# Patient Record
Sex: Female | Born: 1960
Health system: Southern US, Community
[De-identification: ages and names within clinical notes are randomized; demographics above are authoritative.]

## PROBLEM LIST (undated history)

## (undated) DIAGNOSIS — N189 Chronic kidney disease, unspecified: Secondary | ICD-10-CM

## (undated) DIAGNOSIS — H539 Unspecified visual disturbance: Secondary | ICD-10-CM

## (undated) HISTORY — DX: Chronic kidney disease, unspecified: N18.9

## (undated) HISTORY — DX: Unspecified visual disturbance: H53.9

## (undated) HISTORY — PX: MOUTH SURGERY: SHX715

## (undated) HISTORY — PX: ELBOW FRACTURE SURGERY: SHX616

## (undated) HISTORY — PX: ABDOMINOPLASTY: SUR9

---

## 2016-11-18 ENCOUNTER — Encounter: Payer: Self-pay | Admitting: Neurology

## 2016-11-18 ENCOUNTER — Ambulatory Visit (INDEPENDENT_AMBULATORY_CARE_PROVIDER_SITE_OTHER): Payer: BC Managed Care – PPO | Admitting: Neurology

## 2016-11-18 ENCOUNTER — Encounter (INDEPENDENT_AMBULATORY_CARE_PROVIDER_SITE_OTHER): Payer: Self-pay

## 2016-11-18 VITALS — BP 122/85 | HR 79 | Resp 16 | Ht 71.0 in | Wt 174.0 lb

## 2016-11-18 DIAGNOSIS — G250 Essential tremor: Secondary | ICD-10-CM | POA: Diagnosis not present

## 2016-11-18 DIAGNOSIS — F32A Depression, unspecified: Secondary | ICD-10-CM

## 2016-11-18 DIAGNOSIS — R200 Anesthesia of skin: Secondary | ICD-10-CM | POA: Diagnosis not present

## 2016-11-18 DIAGNOSIS — R413 Other amnesia: Secondary | ICD-10-CM | POA: Diagnosis not present

## 2016-11-18 DIAGNOSIS — F320 Major depressive disorder, single episode, mild: Secondary | ICD-10-CM | POA: Diagnosis not present

## 2016-11-18 NOTE — Progress Notes (Signed)
[facility]  PATIENT: Ashlee Hernandez DOB: Dec 20, 1960  REFERRING DOCTOR OR PCP:  Blanchie Serve SOURCE: patient, records from Dr. Bjorn Loser.    _________________________________   HISTORICAL  CHIEF COMPLAINT:  Chief Complaint  Patient presents with  . Memory Loss    Ashlee Hernandez is here for eval of memory loss, first noted about a yr. ago.  Sts. she has particular difficulty with word finding. Denies known family hx. of dementia.  She has a hx. of ETOH and illicit drug use, but has been in recovery for 32 years.  Hx. of hand tremor associated with feeling nervous--has prn Atenolol for this.  Tremor has not worsened over the last 2 yrs./fim    HISTORY OF PRESENT ILLNESS:  I had the pleasure seeing you patient, Ashlee Hernandez, at [facility] for neurologic consultation regarding her memory loss.  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  She feels her symptoms gradually worsened.   She was going through menopause at the time.    Specifically, she notes difficulty coming up with the next word in a sentence and people sometimes quickly say the word she is trying to get out.    This happens at work and at home and does not do worse with stress.  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   She has difficulty with simple sentences as well as complex ones.  She does not note the same problems with writing and feels.     She also feels unfocused and takes extra time with some tasks.   She can focus well if doing only one thing (such as reading for pleasure) but not as well when several activities going on at the same time.     She is sleeping well in general and takes melatonin at niht with benefit.  She sleeps 7 hours nightly and rarely wakes up once asleep.   She snores on her back but has never been told she has OSA symptoms   Montreal Cognitive Assessment  11/18/2016    Visuospatial/ Executive (0/5) 5  Naming (0/3) 3  Attention: Read list of digits (0/2) 2  Attention: Read list of letters (0/1) 1  Attention: Serial 7 subtraction starting at 100 (0/3) 3  Language: Repeat phrase (0/2) 2  Language : Fluency (0/1) 1  Abstraction (0/2) 2  Delayed Recall (0/5) 2  Orientation (0/6) 6  Total 27  Adjusted Score (based on education) 27    About a year ago, she stopped her Wellbutrin, Benadryl and famotidine  Has benign essential tremor that is treated with atenolol. The tremor has worsened over the past 2 years.    The tremor started in her 54's and is worse when she is nervous. 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     Atenolol was started and she takes only if she is going to be in court some times.    The atenolol usually helps.    She also has rare migraines but only one in the last couple years.   Imitrex usually helps.     REVIEW OF SYSTEMS: Constitutional: No fevers, chills, sweats, or change in appetite Eyes: No visual changes, double vision, eye pain Ear, nose and throat: No hearing loss, ear pain, nasal congestion, sore throat Cardiovascular: No chest pain, palpitations Respiratory: No shortness of breath at rest or with exertion.   No  wheezes GastrointestinaI: No nausea, vomiting, diarrhea, abdominal pain, fecal incontinence Genitourinary: No dysuria, urinary retention or frequency.  No nocturia. Musculoskeletal: No neck pain, back pain Integumentary: No rash, pruritus, skin lesions Neurological: as above Psychiatric: No depression at this time.  No anxiety Endocrine: No palpitations, diaphoresis, change in appetite, change in weigh or increased thirst Hematologic/Lymphatic: No anemia, purpura, petechiae. Allergic/Immunologic: No itchy/runny eyes, nasal congestion, recent allergic reactions, rashes  ALLERGIES: Allergies  Allergen Reactions  . Sulfa Antibiotics Hives, Itching and Rash    HOME MEDICATIONS:  Current  Outpatient Prescriptions:  .  ADVAIR DISKUS 100-50 MCG/DOSE AEPB, , Disp: , Rfl:  .  albuterol (PROVENTIL HFA;VENTOLIN HFA) 108 (90 Base) MCG/ACT inhaler, INHALE 2 PUFFS BY MOUTH 4 TIMES A DAY EVERY 4-6 HRS INHALATION 30 **INS WON'T PAY FOR PROVENTIL**, Disp: , Rfl:  .  atenolol (TENORMIN) 25 MG tablet, Take by mouth daily., Disp: , Rfl:  .  simvastatin (ZOCOR) 10 MG tablet, TAKE 1 TABLET IN THE EVENING ONCE A DAY WITH SUPPER ORALLY 90 DAYS, Disp: , Rfl:   PAST MEDICAL HISTORY: Past Medical History:  Diagnosis Date  . Chronic kidney disease   . Vision abnormalities     PAST SURGICAL HISTORY: Past Surgical History:  Procedure Laterality Date  . ABDOMINOPLASTY    . ELBOW FRACTURE SURGERY    . MOUTH SURGERY      FAMILY HISTORY: Family History  Problem Relation Age of Onset  . Heart attack Mother   . Hypertension Mother   . Alpha-1 antitrypsin deficiency Father   . COPD Father     SOCIAL HISTORY:  Social History   Social History  . Marital status: Married    Spouse name: N/A  . Number of children: N/A  . Years of education: N/A   Occupational History  . Not on file.   Social History Main Topics  . Smoking status: Former Research scientist (life sciences)  . Smokeless tobacco: Former Systems developer  . Alcohol use Yes     Comment: 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  . Drug use: Yes     Comment: no use in the last 32 years  . Sexual activity: Not on file   Other Topics Concern  . Not on file   Social History Narrative  . No narrative on file     PHYSICAL EXAM  Vitals:   11/18/16 0845  BP: 122/85  Pulse: 79  Resp: 16  Weight: 174 lb (78.9 kg)  Height: _0  (1.803 m)    Body mass index is 24.27 kg/m.   General: The patient is well-developed and well-nourished and in no acute distress  Eyes:  Funduscopic exam shows normal optic discs and retinal vessels.  Neck: The neck is supple, no carotid bruits are noted.  The neck is nontender.  Cardiovascular: The heart has a regular  rate and rhythm with a normal S1 and S2. There were no murmurs, gallops or rubs. Lungs are clear to auscultation.  Skin: Extremities are without significant edema.  Musculoskeletal:  Back is nontender  Neurologic Exam  Mental status: The patient is alert and oriented x 3 at the time of the examination. On testing, she had mild short-term memory difficulties The patient has apparent normal remote memory, with an apparently normal attention span and concentration ability.   On a few occasions she had trouble coming up with the right word..  Cranial nerves: Extraocular movements are full. Pupils are equal, round, and reactive to light and accomodation.  Visual fields  are full.  Facial symmetry is present. There is good facial sensation to soft touch bilaterally.Facial strength is normal.  Trapezius and sternocleidomastoid strength is normal. No dysarthria is noted.  The tongue is midline, and the patient has symmetric elevation of the soft palate. No obvious hearing deficits are noted.  Motor:  Muscle bulk is normal.   Tone is normal. Strength is  5 / 5 in all 4 extremities.   Sensory: Sensory testing is intact to pinprick, soft touch and vibration sensation in all 4 extremities.  Coordination: Cerebellar testing reveals good finger-nose-finger and heel-to-shin bilaterally.  Gait and station: Station is normal.   Gait is normal. Tandem gait is normal. Romberg is negative.   Reflexes: Deep tendon reflexes are symmetric and normal bilaterally.   Plantar responses are flexor.    DIAGNOSTIC DATA (LABS, IMAGING, TESTING) - I reviewed patient records, labs, notes, testing and imaging myself where available.       ASSESSMENT AND PLAN  Memory impairment - Plan: MR BRAIN WO CONTRAST, Vitamin B12, TSH, Methylmalonic acid, serum, Multiple Myeloma Panel (SPEP&IFE w/QIG)  Numbness - Plan: Vitamin B12, Methylmalonic acid, serum, Multiple Myeloma Panel (SPEP&IFE w/QIG)  Benign essential tremor -  Plan: MR BRAIN WO CONTRAST  Mild depression ([place])   In summary, Ashlee Hernandez is a 56 year old woman has noted cognitive issues difficulties with verbal fluency and short-term memory over the past 2 years. This is affecting her function at work.  On the Montral cognitive assessment, she scored 27/30 is in the normal range but lower than expected for a high functioning educated individual.   Additionally, she has a mild tremor that is likely benign essential tremor and numbness in her feet of unknown etiology. I'll check an MRI of the brain to determine if there is aatrophy that could indicate one of the neurodegenerative processes (AD, FTD, PPA) and to make sure that there are not ischemic issues or other neurologic process. Additionally, we will check labwork for her cognitive issues and numbness (B12, MMA, SPEP/IFE).  If the workup is negative, then another etiology such as depression may be playing a more prominent role.   She will return to see me in 3 months or sooner if there are new or worsening neurologic symptoms.    Thank you for asking me to see Ashlee Hernandez for a neurologic consultation. Please let me know if I can be of further assistance with her or other patients in the future.   Giada Schoppe A. Felecia Shelling, MD, PhD 0/07/3934, 9:40 AM Certified in Neurology, Clinical Neurophysiology, Sleep Medicine, Pain Medicine and Neuroimaging  [facility] [address], [place], [place] [address] [phone]

## 2016-11-22 LAB — TSH: TSH: 1.19 u[IU]/mL (ref 0.450–4.500)

## 2016-11-22 LAB — MULTIPLE MYELOMA PANEL, SERUM
ALPHA 1: 0.2 g/dL (ref 0.0–0.4)
ALPHA2 GLOB SERPL ELPH-MCNC: 0.7 g/dL (ref 0.4–1.0)
Albumin SerPl Elph-Mcnc: 4.1 g/dL (ref 2.9–4.4)
Albumin/Glob SerPl: 1.6 (ref 0.7–1.7)
B-GLOBULIN SERPL ELPH-MCNC: 1 g/dL (ref 0.7–1.3)
GLOBULIN, TOTAL: 2.6 g/dL (ref 2.2–3.9)
Gamma Glob SerPl Elph-Mcnc: 0.8 g/dL (ref 0.4–1.8)
IGM (IMMUNOGLOBULIN M), SRM: 112 mg/dL (ref 26–217)
IgA/Immunoglobulin A, Serum: 65 mg/dL — ABNORMAL LOW (ref 87–352)
IgG (Immunoglobin G), Serum: 691 mg/dL — ABNORMAL LOW (ref 700–1600)
Total Protein: 6.7 g/dL (ref 6.0–8.5)

## 2016-11-22 LAB — METHYLMALONIC ACID, SERUM: Methylmalonic Acid: 145 nmol/L (ref 0–378)

## 2016-11-22 LAB — VITAMIN B12: VITAMIN B 12: 348 pg/mL (ref 232–1245)

## 2016-11-23 ENCOUNTER — Telehealth: Payer: Self-pay | Admitting: *Deleted

## 2016-11-23 NOTE — Telephone Encounter (Signed)
LMOM that per RAS, labs done in our office are normal.  She does not need to return this call unless she has questions/fim 

## 2016-11-23 NOTE — Telephone Encounter (Signed)
-----   Message from Asa Lenteichard A Sater, MD sent at 11/22/2016  7:15 PM EDT ----- Please let her know that the lab work is normal.

## 2016-12-01 ENCOUNTER — Ambulatory Visit (INDEPENDENT_AMBULATORY_CARE_PROVIDER_SITE_OTHER): Payer: BC Managed Care – PPO

## 2016-12-01 DIAGNOSIS — R413 Other amnesia: Secondary | ICD-10-CM

## 2016-12-01 DIAGNOSIS — G250 Essential tremor: Secondary | ICD-10-CM

## 2016-12-03 ENCOUNTER — Telehealth: Payer: Self-pay | Admitting: *Deleted

## 2016-12-03 NOTE — Telephone Encounter (Signed)
LMOM (ok per hippa) that per RAS, MRI brain showed a few spots that are likely age related, nothing noted that would lead to memory difficulties.  She does not need to return this call unless she has questions/fim

## 2016-12-03 NOTE — Telephone Encounter (Signed)
-----   Message from Asa Lente, MD sent at 12/03/2016 12:04 PM EDT ----- Please let her know that the MRI of the brain showed a few spots that are likely age related. There is no atrophy.   There is nothing that should lead to memory difficulties.

## 2017-02-15 ENCOUNTER — Ambulatory Visit: Payer: BC Managed Care – PPO | Admitting: Neurology

## 2017-03-16 ENCOUNTER — Ambulatory Visit: Payer: BC Managed Care – PPO | Admitting: Neurology

## 2017-03-16 ENCOUNTER — Encounter: Payer: Self-pay | Admitting: Neurology

## 2017-03-16 ENCOUNTER — Ambulatory Visit (INDEPENDENT_AMBULATORY_CARE_PROVIDER_SITE_OTHER): Payer: BC Managed Care – PPO | Admitting: Neurology

## 2017-03-16 VITALS — BP 121/89 | HR 74 | Resp 14 | Ht 71.0 in | Wt 167.9 lb

## 2017-03-16 DIAGNOSIS — R413 Other amnesia: Secondary | ICD-10-CM

## 2017-03-16 DIAGNOSIS — F32A Depression, unspecified: Secondary | ICD-10-CM

## 2017-03-16 DIAGNOSIS — F320 Major depressive disorder, single episode, mild: Secondary | ICD-10-CM

## 2017-03-16 DIAGNOSIS — G250 Essential tremor: Secondary | ICD-10-CM

## 2017-03-16 MED ORDER — PHENTERMINE HCL 30 MG PO CAPS: 30.0000 mg | ORAL_CAPSULE | ORAL | 5 refills | Status: DC

## 2017-03-16 NOTE — Progress Notes (Signed)
[facility]  PATIENT: Ashlee Hernandez DOB: [birth year]  REFERRING DOCTOR OR PCP:  Fatima SangerStephen Meyer SOURCE: patient, records from Dr. Daphane ShepherdMeyer.    _________________________________   HISTORICAL  CHIEF COMPLAINT:  Chief Complaint  Patient presents with  . Memory Loss    Sts. she has started Strattera and feels focus is better, but she is still having difficulty with word finding and some short term memory (for ex. couldn't remember where she and her husband went for their anniversary).  Sts. she has not had a problem with tremors and she doesn't notice numbness in feet/fim  . Tremors  . 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  Memory:    She feels her symptoms gradually worsened Over the past couple of years but have been fairly stable since her last visit with me.    4 months ago, she scored 27/30 on a much real cognitive assessment test. When her memory symptoms started, she was going through menopause .    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     She does not note any difficulty coming out with the right words when she writes.   She also feels unfocused and takes extra time with some tasks.   She can focus well if doing only one thing (such as reading for pleasure) but not as well when several activities going on at the same time.    She notes that she remembers a vacation from a couple years ago better than one that she took a few months ago.  Her primary care doctor placed her on Strattera and she feels that it is helping some.   She still notes focus is poor and she sometimes has trouble coming up with the right word.     She is sleeping well in general and takes melatonin at niht with benefit.  She sleeps 7 hours nightly and rarely wakes up once asleep.   She  snores on her back but has never been told she has OSA symptoms.     Montreal Cognitive Assessment  11/18/2016  Visuospatial/ Executive (0/5) 5  Naming (0/3) 3  Attention: Read list of digits (0/2) 2  Attention: Read list of letters (0/1) 1  Attention: Serial 7 subtraction starting at 100 (0/3) 3  Language: Repeat phrase (0/2) 2  Language : Fluency (0/1) 1  Abstraction (0/2) 2  Delayed Recall (0/5) 2  Orientation (0/6) 6  Total 27  Adjusted Score (based on education) 27    Another issue is mild benign essential tremor that has responded to atenolol.   The tremor started in her 40's and is worse when she is nervous. 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     Atenolol was started and she takes only if she is going to be in court some times.    REVIEW OF SYSTEMS: Constitutional: No fevers, chills, sweats, or change in appetite Eyes: No visual changes, double vision, eye pain Ear, nose and throat: No hearing loss, ear pain, nasal congestion, sore throat Cardiovascular: No chest pain, palpitations Respiratory: No shortness of breath at rest or with exertion.   No wheezes GastrointestinaI: No nausea, vomiting, diarrhea, abdominal pain, fecal incontinence Genitourinary: No dysuria, urinary retention or frequency.  No nocturia. Musculoskeletal: No neck pain, back pain Integumentary: No rash, pruritus, skin lesions Neurological: as above Psychiatric: No depression at this time.  No anxiety Endocrine: No palpitations, diaphoresis, change in appetite, change in weigh or increased thirst Hematologic/Lymphatic: No anemia, purpura, petechiae. Allergic/Immunologic: No itchy/runny eyes, nasal congestion, recent allergic reactions, rashes  ALLERGIES: Allergies  Allergen Reactions  . Sulfa Antibiotics Hives, Itching and Rash    HOME MEDICATIONS:  Current Outpatient Prescriptions:  .  ADVAIR DISKUS 100-50 MCG/DOSE AEPB, , Disp: , Rfl:  .  albuterol (PROVENTIL  HFA;VENTOLIN HFA) 108 (90 Base) MCG/ACT inhaler, INHALE 2 PUFFS BY MOUTH 4 TIMES A DAY EVERY 4-6 HRS INHALATION 30 **INS WON'T PAY FOR PROVENTIL**, Disp: , Rfl:  .  atenolol (TENORMIN) 25 MG tablet, Take by mouth daily., Disp: , Rfl:  .  atomoxetine (STRATTERA) 40 MG capsule, , Disp: , Rfl:  .  simvastatin (ZOCOR) 10 MG tablet, TAKE 1 TABLET IN THE EVENING ONCE A DAY WITH SUPPER ORALLY 90 DAYS, Disp: , Rfl:  .  phentermine 30 MG capsule, Take 1 capsule (30 mg total) by mouth every morning., Disp: 30 capsule, Rfl: 5  PAST MEDICAL HISTORY: Past Medical History:  Diagnosis Date  . Chronic kidney disease   . Vision abnormalities     PAST SURGICAL HISTORY: Past Surgical History:  Procedure Laterality Date  . ABDOMINOPLASTY    . ELBOW FRACTURE SURGERY    . MOUTH SURGERY      FAMILY HISTORY: Family History  Problem Relation Age of Onset  . Heart attack Mother   . Hypertension Mother   . Alpha-1 antitrypsin deficiency Father   . COPD Father     SOCIAL HISTORY:  Social History   Social History  . Marital status: Married    Spouse name: N/A  . Number of children: N/A  . Years of education: N/A   Occupational History  . Not on file.   Social History Main Topics  . Smoking status: Former Games developer  . Smokeless tobacco: Former Neurosurgeon  . Alcohol use Yes     Comment: 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  . Drug use: Yes     Comment: no use in the last 32 years  . Sexual activity: Not on file   Other Topics Concern  . Not on file   Social History Narrative  . No narrative on file     PHYSICAL EXAM  Vitals:   03/16/17 0941  BP: 121/89  Pulse: 74  Resp: 14  Weight: 167 lb 14.4 oz (76.2 kg)  Height: 5\' 11"  (1.803 m)    Body mass index is 23.42 kg/m.   General: The patient is well-developed and well-nourished and in no acute distress   Neurologic Exam  Mental status: The patient is alert and oriented x 3 at the time of the examination. STM was 3/3 at 3  minutes The patient has apparent normal remote memory, with an apparently normal attention span and concentration ability ([id number]).  Clock is perfect.   Pattern continuation with one error.   Cranial nerves: Extraocular movements are full. Facial strength is normal. Trapezius and sternocleidomastoid strength is normal. No dysarthria is noted.  The tongue is midline, and the patient has symmetric elevation of the soft palate. No obvious hearing deficits are noted.  Motor:  Muscle bulk is normal.    Strength is  5 / 5 in all 4 extremities.    Gait and station: Station is normal.   Gait is  normal.    Reflexes: Deep tendon reflexes are symmetric and normal bilaterally.      DIAGNOSTIC DATA (LABS, IMAGING, TESTING) - I reviewed patient records, labs, notes, testing and imaging myself where available.       ASSESSMENT AND PLAN  Memory impairment  Mild depression ([facility])  Benign essential tremor   1.   We discussed that her memory issues and other cognitive concerns are much more likely to be attentional than due to a primary degenerative process such as Alzheimer's.    Decreased attention could be due to mood. She does feel a little better on Strattera and it does have antidepressant effects as it is a norepinephrine reuptake inhibitor.    She will continue it. 2.  I think she would benefit from a stimulant but she had had issues with addiction in the past and is reluctant to go on one. Phentermine has some stimulant qualities and overlapped some with a more classic stimulants. I will go ahead and have her try. If she does not note a significant benefit she should stop. 3.  Her brain MRI does show some T2/FLAIR hyperintense foci consistent with mild chronic microvascular ischemic change. Although some changes typical for age, she has more than expected. She was a heavy smoker. She will take aspirin 81 mg daily. 4.  She will return to see me in 6 months or sooner if there are new  or worsening neurologic symptoms.  Richard A. Epimenio Foot, MD, PhD, FAAN Certified in Neurology, Clinical Neurophysiology, Sleep Medicine, Pain Medicine and Neuroimaging Director, [facility]  [facility] [address], [address] [place], [place] [address] [phone]

## 2017-03-17 ENCOUNTER — Telehealth: Payer: Self-pay | Admitting: *Deleted

## 2017-03-17 NOTE — Telephone Encounter (Signed)
PA for Phentermine 30mg  capsules #30/30 completed via Cover My Meds.  Dx: F90.9.  Key# [id number].  [facility] phone# [phone]/fim

## 2017-03-29 NOTE — Telephone Encounter (Signed)
Fax received from [facility]. Phentermine denied.  I lmom for pt. that cash price for Phentermine 30mg  #30 is $16.29.  If she prefers not to use [facility], she can check with several of her local pharmacies to see who has the cheapest price./fim

## 2017-09-10 ENCOUNTER — Other Ambulatory Visit: Payer: Self-pay | Admitting: Neurology

## 2017-11-11 ENCOUNTER — Other Ambulatory Visit: Payer: Self-pay | Admitting: Neurology

## 2017-12-28 ENCOUNTER — Encounter: Payer: Self-pay | Admitting: Neurology

## 2017-12-28 ENCOUNTER — Ambulatory Visit (INDEPENDENT_AMBULATORY_CARE_PROVIDER_SITE_OTHER): Payer: 59 | Admitting: Neurology

## 2017-12-28 ENCOUNTER — Other Ambulatory Visit: Payer: Self-pay

## 2017-12-28 VITALS — BP 124/90 | HR 87 | Resp 16 | Ht 71.0 in | Wt 161.5 lb

## 2017-12-28 DIAGNOSIS — R413 Other amnesia: Secondary | ICD-10-CM | POA: Diagnosis not present

## 2017-12-28 DIAGNOSIS — G4700 Insomnia, unspecified: Secondary | ICD-10-CM | POA: Diagnosis not present

## 2017-12-28 DIAGNOSIS — F988 Other specified behavioral and emotional disorders with onset usually occurring in childhood and adolescence: Secondary | ICD-10-CM

## 2017-12-28 MED ORDER — PHENTERMINE HCL 30 MG PO CAPS: 30.0000 mg | ORAL_CAPSULE | Freq: Every morning | ORAL | 5 refills | Status: DC

## 2017-12-28 NOTE — Progress Notes (Signed)
[facility]  PATIENT: Ashlee Hernandez DOB: [birth year]  REFERRING DOCTOR OR PCP:  Fatima Sanger SOURCE: patient, records from Dr. Daphane Shepherd.    _________________________________   HISTORICAL  CHIEF COMPLAINT:  Chief Complaint  Patient presents with  . Memory Loss    Sts. 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  Update 12/28/2017: She feels her memory is doing better on her current combination of medication of Strattera and Phentermine.   Her only sie effect is dry mouth.   Focus and attention are better.   She feels there are fewer lapses.  She has changed jobs and has less stress.   She is not having any problems at work and mistakes are not common.    She has mild maintenance insomnia.  She always reads before bedtime and sometimes will take longer to get drowsy.    She is taking melatonin.    From 03/16/2017: Memory:    She feels her symptoms gradually worsened Over the past couple of years but have been fairly stable since her last visit with me.    4 months ago, she scored 27/30 on a much real cognitive assessment test. When her memory symptoms started, she was going through menopause .    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     She does not note any difficulty coming out with the right words when she writes.   She also feels unfocused and takes extra time with some tasks.   She can focus well if doing only one thing (such as reading for pleasure) but not as well when several activities going on at the same time.    She notes that she remembers a vacation from a couple years ago better than one that she took a few months ago.  Her primary care doctor placed her on Strattera  and she feels that it is helping some.   She still notes focus is poor and she sometimes has trouble coming up with the right word.     She is sleeping well in general and takes melatonin at niht with benefit.  She sleeps 7 hours nightly and rarely wakes up once asleep.   She snores on her back but has never been told she has OSA symptoms.     Montreal Cognitive Assessment  11/18/2016  Visuospatial/ Executive (0/5) 5  Naming (0/3) 3  Attention: Read list of digits (0/2) 2  Attention: Read list of letters (0/1) 1  Attention: Serial 7 subtraction starting at 100 (0/3) 3  Language: Repeat phrase (0/2) 2  Language : Fluency (0/1) 1  Abstraction (0/2) 2  Delayed Recall (0/5) 2  Orientation (0/6) 6  Total 27  Adjusted Score (based on education) 27    Another issue is mild benign essential tremor that has responded to atenolol.   The tremor started in her 40's and is worse when she is nervous. 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     Atenolol was started and she takes only if she is going to be in court some times.    REVIEW OF SYSTEMS: Constitutional: No fevers, chills, sweats, or change in appetite Eyes:  No visual changes, double vision, eye pain Ear, nose and throat: No hearing loss, ear pain, nasal congestion, sore throat Cardiovascular: No chest pain, palpitations Respiratory: No shortness of breath at rest or with exertion.   No wheezes GastrointestinaI: No nausea, vomiting, diarrhea, abdominal pain, fecal incontinence Genitourinary: No dysuria, urinary retention or frequency.  No nocturia. Musculoskeletal: No neck pain, back pain Integumentary: No rash, pruritus, skin lesions Neurological: as above Psychiatric: No depression at this time.  No anxiety Endocrine: No palpitations, diaphoresis, change in appetite, change in weigh or increased thirst Hematologic/Lymphatic: No anemia, purpura, petechiae. Allergic/Immunologic: No itchy/runny eyes, nasal  congestion, recent allergic reactions, rashes  ALLERGIES: Allergies  Allergen Reactions  . Sulfa Antibiotics Hives, Itching and Rash    HOME MEDICATIONS:  Current Outpatient Medications:  .  ADVAIR DISKUS 100-50 MCG/DOSE AEPB, , Disp: , Rfl:  .  albuterol (PROVENTIL HFA;VENTOLIN HFA) 108 (90 Base) MCG/ACT inhaler, INHALE 2 PUFFS BY MOUTH 4 TIMES A DAY EVERY 4-6 HRS INHALATION 30 **INS WON'T PAY FOR PROVENTIL**, Disp: , Rfl:  .  atenolol (TENORMIN) 25 MG tablet, Take by mouth daily., Disp: , Rfl:  .  atomoxetine (STRATTERA) 40 MG capsule, , Disp: , Rfl:  .  phentermine 30 MG capsule, Take 1 capsule (30 mg total) by mouth every morning., Disp: 30 capsule, Rfl: 5 .  ezetimibe (ZETIA) 10 MG tablet, Take 10 mg by mouth daily., Disp: , Rfl: 2 .  simvastatin (ZOCOR) 10 MG tablet, TAKE 1 TABLET IN THE EVENING ONCE A DAY WITH SUPPER ORALLY 90 DAYS, Disp: , Rfl:   PAST MEDICAL HISTORY: Past Medical History:  Diagnosis Date  . Chronic kidney disease   . Vision abnormalities     PAST SURGICAL HISTORY: Past Surgical History:  Procedure Laterality Date  . ABDOMINOPLASTY    . ELBOW FRACTURE SURGERY    . MOUTH SURGERY      FAMILY HISTORY: Family History  Problem Relation Age of Onset  . Heart attack Mother   . Hypertension Mother   . Alpha-1 antitrypsin deficiency Father   . COPD Father     SOCIAL HISTORY:  Social History   Socioeconomic History  . Marital status: Married    Spouse name: Not on file  . Number of children: Not on file  . Years of education: Not on file  . Highest education level: Not on file  Occupational History  . Not on file  Social Needs  . Financial resource strain: Not on file  . Food insecurity:    Worry: Not on file    Inability: Not on file  . Transportation needs:    Medical: Not on file    Non-medical: Not on file  Tobacco Use  . Smoking status: Former Games developer  . Smokeless tobacco: Former Engineer, water and Sexual Activity  . 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 Drug use: Yes    Comment: no use in the last 32 years  . Sexual activity: Not on file  Lifestyle  . Physical activity:    Days per week: Not on file    Minutes per session: Not on file  . Stress: Not on file  Relationships  . Social connections:    Talks on phone: Not on file    Gets together: Not on file    Attends religious service: Not on file    Active member of club or organization: Not on file  Attends meetings of clubs or organizations: Not on file    Relationship status: Not on file  . Intimate partner violence:    Fear of current or ex partner: Not on file    Emotionally abused: Not on file    Physically abused: Not on file    Forced sexual activity: Not on file  Other Topics Concern  . Not on file  Social History Narrative  . Not on file     PHYSICAL EXAM  Vitals:   12/28/17 0811  BP: 124/90  Pulse: 87  Resp: 16  Weight: 161 lb 8 oz (73.3 kg)  Height:  (1.803 m)    Body mass index is 22.52 kg/m.   General: The patient is well-developed and well-nourished and in no acute distress   Neurologic Exam  Mental status: The patient is alert and oriented x 3 at the time of the examination.  Focus and attention appeared normal.  Short-term memory appears normal.  Pattern continuation with one error.   Cranial nerves: Extraocular movements are full.  Facial strength is normal.  Trapezius strength is normal.  The tongue is midline, and the patient has symmetric elevation of the soft palate. No obvious hearing deficits are noted.  Motor:  Muscle bulk is normal.    Strength is  5 / 5 in all 4 extremities.   Sensory:   intact sensation to temperature   Gait and station: Station is normal.   Gait is normal.  Tandem gait is normal.  Reflexes: Deep tendon reflexes are symmetric and normal bilaterally.      DIAGNOSTIC DATA (LABS, IMAGING, TESTING) - I reviewed patient records, labs, notes,  testing and imaging myself where available.       ASSESSMENT AND PLAN  Memory impairment  Attention deficit disorder (ADD) in adult  Insomnia, unspecified type   1.   She will continue on Strattera.  Attention doing better that is likely related to her improved memory. 2.   Continue melatonin for sleep. 3.   She will return to see me in 6 months or sooner for new or worsening neurologic symptoms.Pearletha Furl. Epimenio Foot, MD, PhD, FAAN Certified in Neurology, Clinical Neurophysiology, Sleep Medicine, Pain Medicine and Neuroimaging Director, [facility]  [facility] [address] [place], [place] [address] [phone]

## 2018-05-24 DIAGNOSIS — E785 Hyperlipidemia, unspecified: Secondary | ICD-10-CM | POA: Diagnosis not present

## 2018-05-24 DIAGNOSIS — Z23 Encounter for immunization: Secondary | ICD-10-CM | POA: Diagnosis not present

## 2018-06-26 ENCOUNTER — Other Ambulatory Visit: Payer: Self-pay | Admitting: Neurology

## 2018-06-30 ENCOUNTER — Ambulatory Visit (INDEPENDENT_AMBULATORY_CARE_PROVIDER_SITE_OTHER): Payer: 59 | Admitting: Neurology

## 2018-06-30 ENCOUNTER — Encounter: Payer: Self-pay | Admitting: Neurology

## 2018-06-30 VITALS — BP 122/77 | HR 77 | Ht 71.0 in | Wt 166.5 lb

## 2018-06-30 DIAGNOSIS — F988 Other specified behavioral and emotional disorders with onset usually occurring in childhood and adolescence: Secondary | ICD-10-CM | POA: Diagnosis not present

## 2018-06-30 DIAGNOSIS — G250 Essential tremor: Secondary | ICD-10-CM | POA: Diagnosis not present

## 2018-06-30 DIAGNOSIS — R413 Other amnesia: Secondary | ICD-10-CM | POA: Diagnosis not present

## 2018-06-30 MED ORDER — ATOMOXETINE HCL 40 MG PO CAPS: 40.0000 mg | ORAL_CAPSULE | Freq: Two times a day (BID) | ORAL | 3 refills | Status: DC

## 2018-06-30 NOTE — Progress Notes (Signed)
[facility]  PATIENT: Ashlee Hernandez DOB: [birth year]  REFERRING DOCTOR OR PCP:  Fatima Sanger SOURCE: patient, records from Dr. Daphane Shepherd.    _________________________________   HISTORICAL  CHIEF COMPLAINT:  Chief Complaint  Patient presents with  . ADD    States she is doing well on her current therapy of Strattera 40mg  BID and Phentermine 30mg  daily.  No new concerns today.     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  Update 06/30/2018: She feels her ADD does well on Strattera and phentermine.   She tolerates these medications well.   Hr memory is doing well.    She still notes mild word finding difficulties at times though is better than before med's.   She is no longer in court so   She sleeps well while asleep but often only gets 6 hours at night during the week and 8 on weekends.      She takes an essential tremor and takes atenolol prn.     Update 12/28/2017: She feels her memory is doing better on her current combination of medication of Strattera and Phentermine.   Her only sie effect is dry mouth.   Focus and attention are better.   She feels there are fewer lapses.  She has changed jobs and has less stress.   She is not having any problems at work and mistakes are not common.    She has mild maintenance insomnia.  She always reads before bedtime and sometimes will take longer to get drowsy.    She is taking melatonin.    From 03/16/2017: Memory:    She feels her symptoms gradually worsened Over the past couple of years but have been fairly stable since her last visit with me.    4 months ago, she scored 27/30 on a much real cognitive assessment test. When her memory symptoms started, she was going through menopause .    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      She does not note any difficulty coming out with the right words when she writes.   She also feels unfocused and takes extra time with some tasks.   She can focus well if doing only one thing (such as reading for pleasure) but not as well when several activities going on at the same time.    She notes that she remembers a vacation from a couple years ago better than one that she took a few months ago.  Her primary care doctor placed her on Strattera and she feels that it is helping some.   She still notes focus is poor and she sometimes has trouble coming up with the right word.     She is sleeping well in general and takes melatonin at niht with benefit.  She sleeps 7 hours nightly and rarely wakes up once asleep.   She snores on her back but has never been told she has OSA symptoms.     Montreal Cognitive Assessment  11/18/2016  Visuospatial/ Executive (0/5) 5  Naming (0/3) 3  Attention: Read list of digits (0/2) 2  Attention: Read list of letters (0/1) 1  Attention: Serial 7 subtraction starting at 100 (0/3) 3  Language: Repeat phrase (0/2) 2  Language : Fluency (0/1) 1  Abstraction (0/2) 2  Delayed Recall (0/5) 2  Orientation (0/6) 6  Total 27  Adjusted Score (based on education) 27    Another issue is mild benign essential tremor that has responded to atenolol.   The tremor started in her 40's and is worse when she is nervous. 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     Atenolol was started and she takes only if she is going to be in court some times.    REVIEW OF SYSTEMS: Constitutional: No fevers, chills, sweats, or change in appetite Eyes: No visual changes, double vision, eye pain Ear, nose and throat: No hearing loss, ear pain, nasal congestion, sore throat Cardiovascular: No chest pain, palpitations Respiratory: No shortness of breath at rest or with exertion.   No wheezes GastrointestinaI: No nausea, vomiting, diarrhea, abdominal pain, fecal  incontinence Genitourinary: No dysuria, urinary retention or frequency.  No nocturia. Musculoskeletal: No neck pain, back pain Integumentary: No rash, pruritus, skin lesions Neurological: as above Psychiatric: No depression at this time.  No anxiety Endocrine: No palpitations, diaphoresis, change in appetite, change in weigh or increased thirst Hematologic/Lymphatic: No anemia, purpura, petechiae. Allergic/Immunologic: No itchy/runny eyes, nasal congestion, recent allergic reactions, rashes  ALLERGIES: Allergies  Allergen Reactions  . Sulfa Antibiotics Hives, Itching and Rash    HOME MEDICATIONS:  Current Outpatient Medications:  .  ADVAIR DISKUS 100-50 MCG/DOSE AEPB, Inhale 1 puff into the lungs 2 (two) times daily. , Disp: , Rfl:  .  albuterol (PROVENTIL HFA;VENTOLIN HFA) 108 (90 Base) MCG/ACT inhaler, INHALE 2 PUFFS BY MOUTH 4 TIMES A DAY EVERY 4-6 HRS INHALATION 30 **INS WON'T PAY FOR PROVENTIL**, Disp: , Rfl:  .  atenolol (TENORMIN) 25 MG tablet, Take 25 mg by mouth as needed. , Disp: , Rfl:  .  atomoxetine (STRATTERA) 40 MG capsule, Take 1 capsule (40 mg total) by mouth 2 (two) times daily., Disp: 180 capsule, Rfl: 3 .  ezetimibe (ZETIA) 10 MG tablet, Take 10 mg by mouth daily., Disp: , Rfl: 2 .  phentermine 30 MG capsule, TAKE 1 CAPSULE BY MOUTH EVERY DAY IN THE MORNING, Disp: 30 capsule, Rfl: 5  PAST MEDICAL HISTORY: Past Medical History:  Diagnosis Date  . Chronic kidney disease   . Vision abnormalities     PAST SURGICAL HISTORY: Past Surgical History:  Procedure Laterality Date  . ABDOMINOPLASTY    . ELBOW FRACTURE SURGERY    . MOUTH SURGERY      FAMILY HISTORY: Family History  Problem Relation Age of Onset  . Heart attack Mother   . Hypertension Mother   . Alpha-1 antitrypsin deficiency Father   . COPD Father     SOCIAL HISTORY:  Social History   Socioeconomic History  . Marital status: Married    Spouse name: Not on file  . Number of children:  Not on file  . Years of education: Not on file  . Highest education level: Not on file  Occupational History  . Not on file  Social Needs  . Financial resource strain: Not on file  . Food insecurity:    Worry: Not on file    Inability: Not on file  . Transportation needs:    Medical: Not on file    Non-medical: Not on file  Tobacco Use  . Smoking status: Former Games developer  . Smokeless tobacco: Former Engineer, water and Sexual Activity  . Alcohol use: Yes    Comment: recovered alcoholic for the last 32  years  . Drug use: Yes    Comment: no use in the last 32 years  . Sexual activity: Not on file  Lifestyle  . Physical activity:    Days per week: Not on file    Minutes per session: Not on file  . Stress: Not on file  Relationships  . Social connections:    Talks on phone: Not on file    Gets together: Not on file    Attends religious service: Not on file    Active member of club or organization: Not on file    Attends meetings of clubs or organizations: Not on file    Relationship status: Not on file  . Intimate partner violence:    Fear of current or ex partner: Not on file    Emotionally abused: Not on file    Physically abused: Not on file    Forced sexual activity: Not on file  Other Topics Concern  . Not on file  Social History Narrative  . Not on file     PHYSICAL EXAM  Vitals:   06/30/18 0859  BP: 122/77  Pulse: 77  Weight: 166 lb 8 oz (75.5 kg)  Height: 5\' 11"  (1.803 m)    Body mass index is 23.22 kg/m.   General: The patient is well-developed and well-nourished and in no acute distress   Neurologic Exam  Mental status: The patient is alert and oriented x 3 at the time of the examination.  Focus and attention appeared normal.  Short-term memory appears normal.  Pattern continuation with one error.   Cranial nerves: Extraocular movements are full.  Facial strength and sensation is normal.  Trapezius strength is normal.  No obvious hearing deficits  are noted.  Motor: No tremors noted currently.  Muscle bulk is normal.    Strength is  5 / 5 in all 4 extremities.    Gait and station: Station is normal.   Gait is normal.  Tandem gait is normal.  Reflexes: Deep tendon reflexes are symmetric and normal bilaterally.      DIAGNOSTIC DATA (LABS, IMAGING, TESTING) - I reviewed patient records, labs, notes, testing and imaging myself where available.       ASSESSMENT AND PLAN  Attention deficit disorder (ADD) in adult  Memory impairment  Benign essential tremor   1.   Continue Strattera and phentermine.  That combination is well-tolerated and is helping her attention deficit disorder and mild attention related memory issues. 2.   Continue melatonin for sleep. 3.   Atenolol as needed tremor  4.   she will return to see me in 6 months or sooner for new or worsening neurologic symptoms.Pearletha Furl. Epimenio Foot, MD, PhD, FAAN Certified in Neurology, Clinical Neurophysiology, Sleep Medicine, Pain Medicine and Neuroimaging Director, [facility] at [facility]  [facility] [address], [address] [place], [place] [address] [phone]

## 2018-10-02 ENCOUNTER — Ambulatory Visit (INDEPENDENT_AMBULATORY_CARE_PROVIDER_SITE_OTHER): Payer: 59 | Admitting: Psychology

## 2018-10-02 DIAGNOSIS — F4323 Adjustment disorder with mixed anxiety and depressed mood: Secondary | ICD-10-CM | POA: Diagnosis not present

## 2018-10-02 DIAGNOSIS — Z1231 Encounter for screening mammogram for malignant neoplasm of breast: Secondary | ICD-10-CM | POA: Diagnosis not present

## 2018-10-19 ENCOUNTER — Ambulatory Visit (INDEPENDENT_AMBULATORY_CARE_PROVIDER_SITE_OTHER): Payer: 59 | Admitting: Psychology

## 2018-10-19 DIAGNOSIS — F4323 Adjustment disorder with mixed anxiety and depressed mood: Secondary | ICD-10-CM

## 2018-11-02 ENCOUNTER — Ambulatory Visit: Payer: 59 | Admitting: Psychology

## 2018-11-06 ENCOUNTER — Ambulatory Visit (INDEPENDENT_AMBULATORY_CARE_PROVIDER_SITE_OTHER): Payer: 59 | Admitting: Psychology

## 2018-11-06 DIAGNOSIS — F4323 Adjustment disorder with mixed anxiety and depressed mood: Secondary | ICD-10-CM

## 2018-11-22 DIAGNOSIS — E785 Hyperlipidemia, unspecified: Secondary | ICD-10-CM | POA: Diagnosis not present

## 2018-11-22 DIAGNOSIS — J45909 Unspecified asthma, uncomplicated: Secondary | ICD-10-CM | POA: Diagnosis not present

## 2018-12-14 ENCOUNTER — Ambulatory Visit (INDEPENDENT_AMBULATORY_CARE_PROVIDER_SITE_OTHER): Payer: 59 | Admitting: Psychology

## 2018-12-14 DIAGNOSIS — F4323 Adjustment disorder with mixed anxiety and depressed mood: Secondary | ICD-10-CM | POA: Diagnosis not present

## 2018-12-21 ENCOUNTER — Telehealth: Payer: Self-pay | Admitting: Neurology

## 2018-12-21 NOTE — Telephone Encounter (Signed)
Noted, just spoke to the patient before I saw this message she is aware of the VV on Tuesday and I informed her I will send her the email.

## 2018-12-21 NOTE — Telephone Encounter (Signed)
Pt called in to give consent for for Web visit, pt was provided with link to download. Pt has it saved on the laptop, pt is prepared for meeting // Meeting scheduled for April 28 @ 8:30am  email- [email or web address]

## 2018-12-25 NOTE — Telephone Encounter (Signed)
Called pt again since no return call. I updated med list/pharmacy/allergies. She confirmed she received email for VV. She will use computer. She is aware she needs to download [employer] webex meetings and will need email.

## 2018-12-25 NOTE — Telephone Encounter (Signed)
Called, LVM for pt to call today so I can update her medication list, pharmacy, allergies on file prior to VV tomorrow.

## 2018-12-26 ENCOUNTER — Encounter: Payer: Self-pay | Admitting: Neurology

## 2018-12-26 ENCOUNTER — Ambulatory Visit (INDEPENDENT_AMBULATORY_CARE_PROVIDER_SITE_OTHER): Payer: 59 | Admitting: Neurology

## 2018-12-26 ENCOUNTER — Other Ambulatory Visit: Payer: Self-pay

## 2018-12-26 DIAGNOSIS — F988 Other specified behavioral and emotional disorders with onset usually occurring in childhood and adolescence: Secondary | ICD-10-CM

## 2018-12-26 DIAGNOSIS — G250 Essential tremor: Secondary | ICD-10-CM | POA: Diagnosis not present

## 2018-12-26 DIAGNOSIS — G4700 Insomnia, unspecified: Secondary | ICD-10-CM | POA: Diagnosis not present

## 2018-12-26 MED ORDER — ATOMOXETINE HCL 40 MG PO CAPS: 40.0000 mg | ORAL_CAPSULE | Freq: Two times a day (BID) | ORAL | 3 refills | Status: DC

## 2018-12-26 MED ORDER — PHENTERMINE HCL 30 MG PO CAPS: ORAL_CAPSULE | ORAL | 5 refills | Status: DC

## 2018-12-26 NOTE — Progress Notes (Signed)
[facility]  PATIENT: Ashlee Hernandez DOB: 1961-01-19  REFERRING DOCTOR OR PCP:  Fatima Sanger SOURCE: patient, records from Dr. Daphane Shepherd.    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  Update 12/26/2018 Virtual Visit via Video Note I connected with Jeanella Flattery  on 12/26/18 at  8:30 AM EDT by a video enabled telemedicine application and verified that I am speaking with the correct person.  I discussed the limitations of evaluation and management by telemedicine and the availability of in person appointments. The patient expressed understanding and agreed to proceed.  History of Present Illness: She feels her ADD has done reaonably well on Strattera with phentermine.    She feels memory is doing ok but not as well as she would like.     She has a mild tremor and only uses atenolol when she goes to court.  She works as a Clinical research associate.   She is sleeping well once asleep but sometimes has trouble falling asleep.   She goes to bed around 10:30-11:00 pm and falls asleep around 1230-1 am.   She usually wakes up around 6 on weekdays and later on weekends.    She takes melatonin at 10:30    Observations/Objective:  She is a well-developed well-nourished woman in no acute distress.  The head is normocephalic and atraumatic.  Sclera are anicteric.  Visible skin appears normal.    She is alert and fully oriented with fluent speech and good attention, knowledge and memory.  Extraocular muscles are intact.  Facial strength is normal.     She appears to have normal strength in the arms.  Tremor is minimal.  Rapid alternating movements and  finger-nose-finger are performed well.  Assessment and Plan: Attention deficit disorder (ADD) in adult  Benign essential tremor  Insomnia, unspecified type  1.   Continue phentermine and Strattera for ADD.  She has done well and tolerates these medications. 2.   She has mild sleep onset insomnia.  She does take melatonin at bedtime but does not take it until she is ready to lay down.  I discussed moving the dose up 60 to 90 minutes as it may help her fall asleep quicker. 3.   Return in 6 months or sooner if there are new or worsening neurologic symptoms.  Follow Up Instructions: I discussed the assessment and treatment plan with the patient. The patient was provided an opportunity to ask questions and all were answered. The patient agreed with the plan and demonstrated an understanding of the instructions.    The patient was advised to call back or seek an in-person evaluation if the symptoms worsen or if the condition fails to improve as anticipated.  I provided 19 minutes of non-face-to-face time during this encounter. _________________________________ From previous visits  Update 06/30/2018: She feels her ADD does well on Strattera and phentermine.   She tolerates these medications well.   Hr memory is doing well.    She still notes mild word finding difficulties at times though is better than before med's.   She is no longer in court so   She sleeps well while  asleep but often only gets 6 hours at night during the week and 8 on weekends.      She takes an essential tremor and takes atenolol prn.     Update 12/28/2017: She feels her memory is doing better on her current combination of medication of Strattera and Phentermine.   Her only sie effect is dry mouth.   Focus and attention are better.   She feels there are fewer lapses.  She has changed jobs and has less stress.   She is not having any problems at work and mistakes are not common.    She has mild maintenance insomnia.  She  always reads before bedtime and sometimes will take longer to get drowsy.    She is taking melatonin.    From 03/16/2017: Memory:    She feels her symptoms gradually worsened Over the past couple of years but have been fairly stable since her last visit with me.    4 months ago, she scored 27/30 on a much real cognitive assessment test. When her memory symptoms started, she was going through menopause .    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     She does not note any difficulty coming out with the right words when she writes.   She also feels unfocused and takes extra time with some tasks.   She can focus well if doing only one thing (such as reading for pleasure) but not as well when several activities going on at the same time.    She notes that she remembers a vacation from a couple years ago better than one that she took a few months ago.  Her primary care doctor placed her on Strattera and she feels that it is helping some.   She still notes focus is poor and she sometimes has trouble coming up with the right word.     She is sleeping well in general and takes melatonin at niht with benefit.  She sleeps 7 hours nightly and rarely wakes up once asleep.   She snores on her back but has never been told she has OSA symptoms.     Montreal Cognitive Assessment  11/18/2016  Visuospatial/ Executive (0/5) 5  Naming (0/3) 3  Attention: Read list of digits (0/2) 2  Attention: Read list of letters (0/1) 1  Attention: Serial 7 subtraction starting at 100 (0/3) 3  Language: Repeat phrase (0/2) 2  Language : Fluency (0/1) 1  Abstraction (0/2) 2  Delayed Recall (0/5) 2  Orientation (0/6) 6  Total 27  Adjusted Score (based on education) 27    Another issue is mild benign essential tremor that has responded to atenolol.   The tremor started in her 40's and is worse when she is nervous. 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     Atenolol was started and she takes only if she is going to be in court some times.    REVIEW OF SYSTEMS: Constitutional: No fevers, chills, sweats, or change in appetite Eyes: No visual changes, double vision, eye pain Ear, nose and throat: No hearing loss, ear pain, nasal congestion, sore throat Cardiovascular: No chest pain, palpitations Respiratory: No shortness of breath at rest or with exertion.   No wheezes GastrointestinaI: No nausea, vomiting, diarrhea, abdominal pain, fecal incontinence Genitourinary: No dysuria, urinary retention or frequency.  No nocturia. Musculoskeletal: No neck pain, back pain Integumentary: No rash, pruritus, skin lesions Neurological: as above Psychiatric: No depression at this time.  No anxiety Endocrine: No palpitations, diaphoresis, change in appetite, change in weigh or increased thirst Hematologic/Lymphatic: No anemia, purpura, petechiae. Allergic/Immunologic: No itchy/runny eyes, nasal congestion, recent allergic reactions, rashes  ALLERGIES: Allergies  Allergen Reactions   Sulfa Antibiotics Hives, Itching and Rash    HOME MEDICATIONS:  Current Outpatient Medications:    ADVAIR DISKUS 100-50 MCG/DOSE AEPB, Inhale 1 puff into the lungs 2 (two) times daily. , Disp: , Rfl:    albuterol (PROVENTIL HFA;VENTOLIN HFA) 108 (90 Base) MCG/ACT inhaler, INHALE 2 PUFFS BY MOUTH 4 TIMES A DAY EVERY 4-6 HRS INHALATION 30 **INS WON'T PAY FOR PROVENTIL**, Disp: , Rfl:    ASPIRIN 81 PO, Take 1 tablet by mouth daily., Disp: , Rfl:    atenolol (TENORMIN) 25 MG tablet, Take 25 mg by mouth as needed. , Disp: , Rfl:    atomoxetine (STRATTERA) 40 MG capsule, Take 1 capsule (40 mg total) by mouth 2 (two) times daily., Disp: 180 capsule, Rfl: 3   B Complex Vitamins (VITAMIN B-COMPLEX PO), Take by mouth., Disp: , Rfl:    ezetimibe (ZETIA) 10 MG tablet, Take 10 mg by mouth daily., Disp: , Rfl: 2   phentermine 30 MG  capsule, TAKE 1 CAPSULE BY MOUTH EVERY DAY IN THE MORNING, Disp: 30 capsule, Rfl: 5  PAST MEDICAL HISTORY: Past Medical History:  Diagnosis Date   Chronic kidney disease    Vision abnormalities     PAST SURGICAL HISTORY: Past Surgical History:  Procedure Laterality Date   ABDOMINOPLASTY     ELBOW FRACTURE SURGERY     MOUTH SURGERY      FAMILY HISTORY: Family History  Problem Relation Age of Onset   Heart attack Mother    Hypertension Mother    Alpha-1 antitrypsin deficiency Father    COPD Father     SOCIAL HISTORY:  Social History   Socioeconomic History   Marital status: Married    Spouse name: Not on file   Number of children: Not on file   Years of education: Not on file   Highest education level: Not on file  Occupational History   Not on file  Social Needs   Financial resource strain: Not on file   Food insecurity:    Worry: Not on file    Inability: Not on file   Transportation needs:    Medical: Not on file    Non-medical: Not on file  Tobacco Use   Smoking status: Former Smoker   Smokeless tobacco: Former Engineer, waterUser  Substance and Sexual Activity   Alcohol use: Yes    Comment: 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   Drug use: Yes    Comment: no use in the last 32 years   Sexual activity: Not on file  Lifestyle   Physical activity:    Days per week: Not on file    Minutes per session: Not on file   Stress: Not on file  Relationships   Social connections:    Talks on phone: Not on file    Gets together: Not on file    Attends religious service: Not on file    Active member of club or organization: Not on file    Attends meetings of clubs or organizations: Not on file    Relationship status: Not on file   Intimate partner violence:    Fear  of current or ex partner: Not on file    Emotionally abused: Not on file    Physically abused: Not on file    Forced sexual activity: Not on file  Other Topics Concern   Not on  file  Social History Narrative   Not on file     PHYSICAL EXAM  There were no vitals filed for this visit.  There is no height or weight on file to calculate BMI.   General: The patient is well-developed and well-nourished and in no acute distress   Neurologic Exam  Mental status: The patient is alert and oriented x 3 at the time of the examination.  Focus and attention appeared normal.  Short-term memory appears normal.  Pattern continuation with one error.   Cranial nerves: Extraocular movements are full.  Facial strength and sensation is normal.  Trapezius strength is normal.  No obvious hearing deficits are noted.  Motor: No tremors noted currently.  Muscle bulk is normal.    Strength is  5 / 5 in all 4 extremities.    Gait and station: Station is normal.   Gait is normal.  Tandem gait is normal.  Reflexes: Deep tendon reflexes are symmetric and normal bilaterally.      DIAGNOSTIC DATA (LABS, IMAGING, TESTING) - I reviewed patient records, labs, notes, testing and imaging myself where available.       ASSESSMENT AND PLAN  Attention deficit disorder (ADD) in adult  Benign essential tremor  Insomnia, unspecified type  Lytle Malburg A. Epimenio Foot, MD, PhD, FAAN Certified in Neurology, Clinical Neurophysiology, Sleep Medicine, Pain Medicine and Neuroimaging Director, [facility]  [facility] [address], [address] [place], [place] [address] [phone]

## 2018-12-28 ENCOUNTER — Ambulatory Visit (INDEPENDENT_AMBULATORY_CARE_PROVIDER_SITE_OTHER): Payer: 59 | Admitting: Psychology

## 2018-12-28 DIAGNOSIS — F4323 Adjustment disorder with mixed anxiety and depressed mood: Secondary | ICD-10-CM | POA: Diagnosis not present

## 2018-12-29 ENCOUNTER — Ambulatory Visit: Payer: 59 | Admitting: Neurology

## 2019-01-15 ENCOUNTER — Ambulatory Visit (INDEPENDENT_AMBULATORY_CARE_PROVIDER_SITE_OTHER): Payer: 59 | Admitting: Psychology

## 2019-01-15 DIAGNOSIS — F4323 Adjustment disorder with mixed anxiety and depressed mood: Secondary | ICD-10-CM | POA: Diagnosis not present

## 2019-01-29 ENCOUNTER — Ambulatory Visit (INDEPENDENT_AMBULATORY_CARE_PROVIDER_SITE_OTHER): Payer: 59 | Admitting: Psychology

## 2019-01-29 DIAGNOSIS — F4323 Adjustment disorder with mixed anxiety and depressed mood: Secondary | ICD-10-CM | POA: Diagnosis not present

## 2019-02-12 ENCOUNTER — Ambulatory Visit (INDEPENDENT_AMBULATORY_CARE_PROVIDER_SITE_OTHER): Payer: 59 | Admitting: Psychology

## 2019-02-12 DIAGNOSIS — F4323 Adjustment disorder with mixed anxiety and depressed mood: Secondary | ICD-10-CM | POA: Diagnosis not present

## 2019-02-26 ENCOUNTER — Ambulatory Visit (INDEPENDENT_AMBULATORY_CARE_PROVIDER_SITE_OTHER): Payer: 59 | Admitting: Psychology

## 2019-02-26 DIAGNOSIS — F4323 Adjustment disorder with mixed anxiety and depressed mood: Secondary | ICD-10-CM

## 2019-03-12 ENCOUNTER — Ambulatory Visit (INDEPENDENT_AMBULATORY_CARE_PROVIDER_SITE_OTHER): Payer: 59 | Admitting: Psychology

## 2019-03-12 DIAGNOSIS — F4323 Adjustment disorder with mixed anxiety and depressed mood: Secondary | ICD-10-CM

## 2019-03-26 ENCOUNTER — Ambulatory Visit (INDEPENDENT_AMBULATORY_CARE_PROVIDER_SITE_OTHER): Payer: 59 | Admitting: Psychology

## 2019-03-26 DIAGNOSIS — F4323 Adjustment disorder with mixed anxiety and depressed mood: Secondary | ICD-10-CM | POA: Diagnosis not present

## 2019-04-09 ENCOUNTER — Ambulatory Visit: Payer: 59 | Admitting: Psychology

## 2019-04-23 ENCOUNTER — Ambulatory Visit (INDEPENDENT_AMBULATORY_CARE_PROVIDER_SITE_OTHER): Payer: 59 | Admitting: Psychology

## 2019-04-23 DIAGNOSIS — F4323 Adjustment disorder with mixed anxiety and depressed mood: Secondary | ICD-10-CM | POA: Diagnosis not present

## 2019-05-21 ENCOUNTER — Ambulatory Visit (INDEPENDENT_AMBULATORY_CARE_PROVIDER_SITE_OTHER): Payer: 59 | Admitting: Psychology

## 2019-05-21 DIAGNOSIS — F4323 Adjustment disorder with mixed anxiety and depressed mood: Secondary | ICD-10-CM | POA: Diagnosis not present

## 2019-06-04 ENCOUNTER — Ambulatory Visit (INDEPENDENT_AMBULATORY_CARE_PROVIDER_SITE_OTHER): Payer: 59 | Admitting: Psychology

## 2019-06-04 DIAGNOSIS — F4323 Adjustment disorder with mixed anxiety and depressed mood: Secondary | ICD-10-CM

## 2019-06-18 ENCOUNTER — Ambulatory Visit (INDEPENDENT_AMBULATORY_CARE_PROVIDER_SITE_OTHER): Payer: 59 | Admitting: Psychology

## 2019-06-18 DIAGNOSIS — F4323 Adjustment disorder with mixed anxiety and depressed mood: Secondary | ICD-10-CM | POA: Diagnosis not present

## 2019-06-27 ENCOUNTER — Ambulatory Visit: Payer: 59 | Admitting: Family Medicine

## 2019-07-02 ENCOUNTER — Other Ambulatory Visit: Payer: Self-pay | Admitting: Neurology

## 2019-07-02 ENCOUNTER — Ambulatory Visit (INDEPENDENT_AMBULATORY_CARE_PROVIDER_SITE_OTHER): Payer: 59 | Admitting: Psychology

## 2019-07-02 DIAGNOSIS — F4323 Adjustment disorder with mixed anxiety and depressed mood: Secondary | ICD-10-CM | POA: Diagnosis not present

## 2019-07-08 NOTE — Progress Notes (Signed)
PATIENT: Ashlee Hernandez DOB: 04/10/1961  REASON FOR VISIT: follow up HISTORY FROM: patient  Virtual Visit via Telephone Note  I connected with Ashlee Hernandez on 07/08/19 at  8:00 AM EST by telephone and verified that I am speaking with the correct person using two identifiers.   I discussed the limitations, risks, security and privacy concerns of performing an evaluation and management service by telephone and the availability of in person appointments. I also discussed with the patient that there may be a patient responsible charge related to this service. The patient expressed understanding and agreed to proceed.   History of Present Illness:  07/08/19 Ashlee Hernandez is a 58 y.o. female here today for follow up for ADD, essential tremor and insomnia. She continues Strattera 40mg  and phentermine 30mg  daily for ADD. She takes atenolol as needed for tremor. Melatonin for insomnia. She works with psychology regularly as well for history of alcoholism. She is doing very well.    History (copied from Dr Garth Bigness note on 12/26/2018)  She feels her ADD has done reaonably well on Strattera with phentermine.    She feels memory is doing ok but not as well as she would like.     She has a mild tremor and only uses atenolol when she goes to court.  She works as a Chief Executive Officer.   She is sleeping well once asleep but sometimes has trouble falling asleep.   She goes to bed around 10:30-11:00 pm and falls asleep around 1230-1 am.   She usually wakes up around 6 on weekdays and later on weekends.    She takes melatonin at 10:30     Observations/Objective:  Generalized: Well developed, in no acute distress  Mentation: Alert oriented to time, place, history taking. Follows all commands speech and language fluent   Assessment and Plan:  58 y.o. year old female  has a past medical history of Chronic kidney disease and Vision abnormalities. here with    ICD-10-CM   1. Benign essential tremor  G25.0   2.  Attention deficit disorder (ADD) in adult  F98.8   3. Insomnia, unspecified type  G47.00    Ashlee Hernandez is doing very well.  She feels that tremor is well managed.  Most days she does not even note a tremor.  She does use atenolol as needed.  We will continue this therapy.  She will also continue Strattera and phentermine as prescribed for ADD.  We have discussed risk of long-term phentermine use, however, I feel that this is the best treatment option for her at this time.  She is a recovering alcoholic and is not interested in amphetamines for treatment.  She will continue with behavioral therapy.  Melatonin as needed for insomnia.  She will call for any worsening or new symptoms.  She will follow-up in 6 months, sooner if needed.  She verbalizes understanding and agreement with this plan.   No orders of the defined types were placed in this encounter.   No orders of the defined types were placed in this encounter.    Follow Up Instructions:  I discussed the assessment and treatment plan with the patient. The patient was provided an opportunity to ask questions and all were answered. The patient agreed with the plan and demonstrated an understanding of the instructions.   The patient was advised to call back or seek an in-person evaluation if the symptoms worsen or if the condition fails to improve as anticipated.  I provided 15 minutes of  non-face-to-face time during this encounter.  Patient is located at her place of residence during my chart visit.  Provider is located in the office.   Shawnie Dapper, NP

## 2019-07-10 ENCOUNTER — Encounter: Payer: Self-pay | Admitting: Family Medicine

## 2019-07-10 ENCOUNTER — Telehealth (INDEPENDENT_AMBULATORY_CARE_PROVIDER_SITE_OTHER): Payer: 59 | Admitting: Family Medicine

## 2019-07-10 DIAGNOSIS — G250 Essential tremor: Secondary | ICD-10-CM

## 2019-07-10 DIAGNOSIS — G4700 Insomnia, unspecified: Secondary | ICD-10-CM | POA: Diagnosis not present

## 2019-07-10 DIAGNOSIS — F988 Other specified behavioral and emotional disorders with onset usually occurring in childhood and adolescence: Secondary | ICD-10-CM

## 2019-07-10 NOTE — Progress Notes (Signed)
I have read the note, and I agree with the clinical assessment and plan.  Elyon Zoll A. Riku Buttery, MD, PhD, FAAN Certified in Neurology, Clinical Neurophysiology, Sleep Medicine, Pain Medicine and Neuroimaging  [facility] [address], [address] [place], [place] [address] [phone]  

## 2019-07-16 ENCOUNTER — Ambulatory Visit (INDEPENDENT_AMBULATORY_CARE_PROVIDER_SITE_OTHER): Payer: 59 | Admitting: Psychology

## 2019-07-16 DIAGNOSIS — F4323 Adjustment disorder with mixed anxiety and depressed mood: Secondary | ICD-10-CM | POA: Diagnosis not present

## 2019-07-30 ENCOUNTER — Ambulatory Visit: Payer: 59 | Admitting: Psychology

## 2019-08-13 ENCOUNTER — Ambulatory Visit: Payer: 59 | Admitting: Psychology

## 2019-08-22 ENCOUNTER — Ambulatory Visit (INDEPENDENT_AMBULATORY_CARE_PROVIDER_SITE_OTHER): Payer: 59 | Admitting: Psychology

## 2019-08-22 DIAGNOSIS — F4323 Adjustment disorder with mixed anxiety and depressed mood: Secondary | ICD-10-CM | POA: Diagnosis not present

## 2019-08-27 ENCOUNTER — Ambulatory Visit: Payer: 59 | Admitting: Psychology

## 2019-09-10 ENCOUNTER — Ambulatory Visit: Payer: 59 | Admitting: Psychology

## 2019-09-24 ENCOUNTER — Ambulatory Visit (INDEPENDENT_AMBULATORY_CARE_PROVIDER_SITE_OTHER): Payer: 59 | Admitting: Psychology

## 2019-09-24 DIAGNOSIS — F4323 Adjustment disorder with mixed anxiety and depressed mood: Secondary | ICD-10-CM | POA: Diagnosis not present

## 2019-10-08 ENCOUNTER — Ambulatory Visit (INDEPENDENT_AMBULATORY_CARE_PROVIDER_SITE_OTHER): Payer: 59 | Admitting: Psychology

## 2019-10-08 DIAGNOSIS — F4323 Adjustment disorder with mixed anxiety and depressed mood: Secondary | ICD-10-CM | POA: Diagnosis not present

## 2019-10-22 ENCOUNTER — Ambulatory Visit (INDEPENDENT_AMBULATORY_CARE_PROVIDER_SITE_OTHER): Payer: 59 | Admitting: Psychology

## 2019-10-22 DIAGNOSIS — F4323 Adjustment disorder with mixed anxiety and depressed mood: Secondary | ICD-10-CM

## 2019-11-05 ENCOUNTER — Ambulatory Visit (INDEPENDENT_AMBULATORY_CARE_PROVIDER_SITE_OTHER): Payer: 59 | Admitting: Psychology

## 2019-11-05 DIAGNOSIS — F4323 Adjustment disorder with mixed anxiety and depressed mood: Secondary | ICD-10-CM | POA: Diagnosis not present

## 2019-11-19 ENCOUNTER — Ambulatory Visit: Payer: 59 | Admitting: Psychology

## 2019-12-03 ENCOUNTER — Ambulatory Visit (INDEPENDENT_AMBULATORY_CARE_PROVIDER_SITE_OTHER): Payer: 59 | Admitting: Psychology

## 2019-12-03 DIAGNOSIS — F4323 Adjustment disorder with mixed anxiety and depressed mood: Secondary | ICD-10-CM | POA: Diagnosis not present

## 2019-12-17 ENCOUNTER — Ambulatory Visit (INDEPENDENT_AMBULATORY_CARE_PROVIDER_SITE_OTHER): Payer: 59 | Admitting: Psychology

## 2019-12-17 DIAGNOSIS — F4323 Adjustment disorder with mixed anxiety and depressed mood: Secondary | ICD-10-CM

## 2019-12-31 ENCOUNTER — Ambulatory Visit (INDEPENDENT_AMBULATORY_CARE_PROVIDER_SITE_OTHER): Payer: 59 | Admitting: Psychology

## 2019-12-31 DIAGNOSIS — F4323 Adjustment disorder with mixed anxiety and depressed mood: Secondary | ICD-10-CM | POA: Diagnosis not present

## 2020-01-09 ENCOUNTER — Ambulatory Visit: Payer: 59 | Admitting: Neurology

## 2020-01-09 ENCOUNTER — Encounter: Payer: Self-pay | Admitting: Neurology

## 2020-01-14 ENCOUNTER — Ambulatory Visit (INDEPENDENT_AMBULATORY_CARE_PROVIDER_SITE_OTHER): Payer: 59 | Admitting: Psychology

## 2020-01-14 DIAGNOSIS — F4323 Adjustment disorder with mixed anxiety and depressed mood: Secondary | ICD-10-CM

## 2020-02-11 ENCOUNTER — Ambulatory Visit (INDEPENDENT_AMBULATORY_CARE_PROVIDER_SITE_OTHER): Payer: 59 | Admitting: Psychology

## 2020-02-11 DIAGNOSIS — F4323 Adjustment disorder with mixed anxiety and depressed mood: Secondary | ICD-10-CM

## 2020-02-25 ENCOUNTER — Ambulatory Visit (INDEPENDENT_AMBULATORY_CARE_PROVIDER_SITE_OTHER): Payer: 59 | Admitting: Psychology

## 2020-02-25 DIAGNOSIS — F4323 Adjustment disorder with mixed anxiety and depressed mood: Secondary | ICD-10-CM | POA: Diagnosis not present

## 2020-03-10 ENCOUNTER — Ambulatory Visit (INDEPENDENT_AMBULATORY_CARE_PROVIDER_SITE_OTHER): Payer: 59 | Admitting: Psychology

## 2020-03-10 DIAGNOSIS — F4323 Adjustment disorder with mixed anxiety and depressed mood: Secondary | ICD-10-CM

## 2020-04-07 ENCOUNTER — Ambulatory Visit (INDEPENDENT_AMBULATORY_CARE_PROVIDER_SITE_OTHER): Payer: 59 | Admitting: Psychology

## 2020-04-07 DIAGNOSIS — F4323 Adjustment disorder with mixed anxiety and depressed mood: Secondary | ICD-10-CM

## 2020-04-21 ENCOUNTER — Ambulatory Visit (INDEPENDENT_AMBULATORY_CARE_PROVIDER_SITE_OTHER): Payer: 59 | Admitting: Psychology

## 2020-04-21 DIAGNOSIS — F4323 Adjustment disorder with mixed anxiety and depressed mood: Secondary | ICD-10-CM

## 2020-05-19 ENCOUNTER — Ambulatory Visit (INDEPENDENT_AMBULATORY_CARE_PROVIDER_SITE_OTHER): Payer: 59 | Admitting: Psychology

## 2020-05-19 DIAGNOSIS — F4323 Adjustment disorder with mixed anxiety and depressed mood: Secondary | ICD-10-CM | POA: Diagnosis not present

## 2020-06-02 ENCOUNTER — Ambulatory Visit: Payer: 59 | Admitting: Psychology

## 2020-06-02 ENCOUNTER — Ambulatory Visit (INDEPENDENT_AMBULATORY_CARE_PROVIDER_SITE_OTHER): Payer: 59 | Admitting: Psychology

## 2020-06-02 DIAGNOSIS — F4323 Adjustment disorder with mixed anxiety and depressed mood: Secondary | ICD-10-CM

## 2020-06-16 ENCOUNTER — Ambulatory Visit: Payer: 59 | Admitting: Psychology

## 2020-06-30 ENCOUNTER — Ambulatory Visit (INDEPENDENT_AMBULATORY_CARE_PROVIDER_SITE_OTHER): Payer: 59 | Admitting: Psychology

## 2020-06-30 DIAGNOSIS — F4323 Adjustment disorder with mixed anxiety and depressed mood: Secondary | ICD-10-CM

## 2020-07-02 ENCOUNTER — Encounter: Payer: Self-pay | Admitting: Internal Medicine

## 2020-07-02 ENCOUNTER — Other Ambulatory Visit: Payer: Self-pay

## 2020-07-02 ENCOUNTER — Ambulatory Visit (INDEPENDENT_AMBULATORY_CARE_PROVIDER_SITE_OTHER): Payer: 59 | Admitting: Internal Medicine

## 2020-07-02 VITALS — BP 142/96 | HR 90 | Ht 71.0 in | Wt 182.0 lb

## 2020-07-02 DIAGNOSIS — R0602 Shortness of breath: Secondary | ICD-10-CM | POA: Diagnosis not present

## 2020-07-02 DIAGNOSIS — R079 Chest pain, unspecified: Secondary | ICD-10-CM | POA: Diagnosis not present

## 2020-07-02 DIAGNOSIS — Z8249 Family history of ischemic heart disease and other diseases of the circulatory system: Secondary | ICD-10-CM | POA: Diagnosis not present

## 2020-07-02 DIAGNOSIS — E7849 Other hyperlipidemia: Secondary | ICD-10-CM

## 2020-07-02 MED ORDER — ATORVASTATIN CALCIUM 40 MG PO TABS: 40.0000 mg | ORAL_TABLET | Freq: Every day | ORAL | 3 refills | Status: AC

## 2020-07-02 MED ORDER — METOPROLOL TARTRATE 50 MG PO TABS: ORAL_TABLET | ORAL | 0 refills | Status: DC

## 2020-07-02 NOTE — Patient Instructions (Addendum)
Medication Instructions:  Increase lipitor to 40mg daily. *If you need a refill on your cardiac medications before your next appointment, please call your pharmacy*   Lab Work: Your physician recommends that you have labs done today: BMET  FASTING lipid panel before your next visit with Dr. Hilty  If you have labs (blood work) drawn today and your tests are completely normal, you will receive your results only by: . MyChart Message (if you have MyChart) OR . A paper copy in the mail If you have any lab test that is abnormal or we need to change your treatment, we will call you to review the results.   Testing/Procedures: Coronary CT study at [facility]  Follow-Up: At [facility], you and your health needs are our priority.  As part of our continuing mission to provide you with exceptional heart care, we have created designated Provider Care Teams.  These Care Teams include your primary Cardiologist (physician) and Advanced Practice Providers (APPs -  Physician Assistants and Nurse Practitioners) who all work together to provide you with the care you need, when you need it.  We recommend signing up for the patient portal called "MyChart".  Sign up information is provided on this After Visit Summary.  MyChart is used to connect with patients for Virtual Visits (Telemedicine).  Patients are able to view lab/test results, encounter notes, upcoming appointments, etc.  Non-urgent messages can be sent to your provider as well.   To learn more about what you can do with MyChart, go to [email or web address].    Your next appointment:   3 month(s)  The format for your next appointment:   In Person  Provider:   K. Chad Hilty, MD  Your cardiac CT will be scheduled at one of the below locations:    [facility] [address] [place], [place] [address] [phone]  OR  [facility] [address] [place], [place]  [address] [phone]  If scheduled at [facility], please arrive at the North Tower main entrance of [facility] 30 minutes prior to test start time. Proceed to the [facility] (first floor) to check-in and test prep.  If scheduled at [facility], please arrive 15 mins early for check-in and test prep.  Please follow these instructions carefully (unless otherwise directed):  Hold all erectile dysfunction medications at least 3 days (72 hrs) prior to test.  On the Night Before the Test: . Be sure to Drink plenty of water. . Do not consume any caffeinated/decaffeinated beverages or chocolate 12 hours prior to your test. . Do not take any antihistamines 12 hours prior to your test.  On the Day of the Test: . Drink plenty of water. Do not drink any water within one hour of the test. . Do not eat any food 4 hours prior to the test. . You may take your regular medications prior to the test.  . Take metoprolol (Lopressor) two hours prior to test. . FEMALES- please wear underwire-free bra if available      After the Test: . Drink plenty of water. . After receiving IV contrast, you may experience a mild flushed feeling. This is normal. . On occasion, you may experience a mild rash up to 24 hours after the test. This is not dangerous. If this occurs, you can take Benadryl 25 mg and increase your fluid intake. . If you experience trouble breathing, this can be serious.   If it is severe call 911 IMMEDIATELY. If it is mild, please call our office.    Once we have confirmed authorization from your insurance company, we will call you to set up a date and time for your test. Based on how quickly your insurance processes prior authorizations requests, please allow up to 4 weeks to be contacted for scheduling your Cardiac CT appointment. Be advised that routine Cardiac CT appointments could be scheduled as many as 8 weeks after your provider  has ordered it.  For non-scheduling related questions, please contact the cardiac imaging nurse navigator should you have any questions/concerns: Marchia Bond, Cardiac Imaging Nurse Navigator Burley Saver, Interim Cardiac Imaging Nurse [place] [facility] Direct Office Dial: [phone]   For scheduling needs, including cancellations and rescheduling, please call Vivien Rota at [phone], option 3.

## 2020-07-02 NOTE — Progress Notes (Addendum)
LIPID CLINIC CONSULT NOTE  Chief Complaint:  Manage dyslipidemia  Primary Care Physician: Blanchie Serve, MD  Primary Cardiologist:  No primary care provider on file.  HPI:  Ashlee Hernandez is a 59 y.o. female who is being seen today for the evaluation of dyslipidemia at the request of [name], NP.  This is a pleasant 59 year old female kindly referred for evaluation management of dyslipidemia.  She reports a longstanding history of dyslipidemia and unfortunately statin intolerance.  She has been recently evaluated by her PCP and was noted to have significantly elevated cholesterol.  She had previously been on simvastatin but had possible memory loss related to that.  It is questionable whether she had some myalgias however stopped the medication and subsequently was diagnosed with ADHD.  She now thinks that the memory issues may be related to that.  More recently she was started on 10 mg of atorvastatin and she has also been on ezetimibe 10 mg daily.  In review of her previous lipid profiles more recently total cholesterol was noted to be 275, triglycerides 229, HDL 46 and LDL 185.  She reports being a former smoker but quit many years ago.  She has 2 children.  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  Family history significant for father who died of lung disease secondary to alpha-1 antitrypsin deficiency in mother who is alive but has hyperlipidemia and coronary disease with a heart attack in her 5s.  Additionally, when discussing her review of systems today, more recently she has noted some increasing fatigue with exercise.  She and her husband have a home gym set up and she notes that over the past several months it has been increasingly difficult for her to exercise.  She does get some discomfort across the upper back and chest which is worse with exertion and is relieved ultimately with rest.  PMHx:  Past Medical History:   Diagnosis Date  . Chronic kidney disease   . Vision abnormalities     Past Surgical History:  Procedure Laterality Date  . ABDOMINOPLASTY    . ELBOW FRACTURE SURGERY    . MOUTH SURGERY      FAMHx:  Family History  Problem Relation Age of Onset  . Heart attack Mother   . Hypertension Mother   . Alpha-1 antitrypsin deficiency Father   . COPD Father     SOCHx:   reports that she has quit smoking. She has quit using smokeless tobacco. She reports previous alcohol use. She reports previous drug use.  ALLERGIES:  Allergies  Allergen Reactions  . Sulfa Antibiotics Hives, Itching and Rash  . Simvastatin     Memory issues    ROS: Pertinent items noted in HPI and remainder of comprehensive ROS otherwise negative.  HOME MEDS: Current Outpatient Medications on File Prior to Visit  Medication Sig Dispense Refill  . ADVAIR DISKUS 100-50 MCG/DOSE AEPB Inhale 1 puff into the lungs 2 (two) times daily.     Marland Kitchen albuterol (PROVENTIL HFA;VENTOLIN HFA) 108 (90 Base) MCG/ACT inhaler INHALE 2 PUFFS BY MOUTH 4 TIMES A DAY EVERY 4-6 HRS INHALATION 30 **INS WON'T PAY FOR PROVENTIL**    . ASPIRIN 81 PO Take 1 tablet by mouth daily.    . B Complex Vitamins (VITAMIN B-COMPLEX PO) Take by mouth.    Marland Kitchen buPROPion (WELLBUTRIN XL) 300 MG 24 hr tablet Take by mouth.    . clobetasol (TEMOVATE) 0.05 % external  solution Apply 1 application topically daily as needed.    . ezetimibe (ZETIA) 10 MG tablet Take 10 mg by mouth daily.  2  . fluorouracil (EFUDEX) 5 % cream Apply 1 application topically daily.    . Omega-3 Fatty Acids (FISH OIL) 1000 MG CAPS Take by mouth.     No current facility-administered medications on file prior to visit.    LABS/IMAGING: Results for orders placed or performed in visit on 07/02/20 (from the past 48 hour(s))  Basic metabolic panel     Status: None   Collection Time: 07/02/20  4:09 PM  Result Value Ref Range   Glucose 90 65 - 99 mg/dL   BUN 11 6 - 24 mg/dL    Creatinine, Ser 0.93 0.57 - 1.00 mg/dL   GFR calc non Af Amer 68 >59 mL/min/1.73   GFR calc Af Amer 79 >59 mL/min/1.73    Comment: **In accordance with recommendations from the NKF-ASN Task force,**   [facility] is in the process of updating its eGFR calculation to the   2021 CKD-EPI creatinine equation that estimates kidney function   without a race variable.    BUN/Creatinine Ratio 12 9 - 23   Sodium 143 134 - 144 mmol/L   Potassium 4.5 3.5 - 5.2 mmol/L   Chloride 104 96 - 106 mmol/L   CO2 26 20 - 29 mmol/L   Calcium 9.6 8.7 - 10.2 mg/dL   No results found.  LIPID PANEL: No results found for: CHOL, TRIG, HDL, CHOLHDL, VLDL, LDLCALC, LDLDIRECT  WEIGHTS: Wt Readings from Last 3 Encounters:  07/02/20 182 lb (82.6 kg)  06/30/18 166 lb 8 oz (75.5 kg)  12/28/17 161 lb 8 oz (73.3 kg)    VITALS: BP (!) 142/96   Pulse 90   Ht 5' 11"  (1.803 m)   Wt 182 lb (82.6 kg)   SpO2 99%   BMI 25.38 kg/m   EXAM: General appearance: alert and no distress Neck: no carotid bruit, no JVD and thyroid not enlarged, symmetric, no tenderness/mass/nodules Lungs: clear to auscultation bilaterally Heart: regular rate and rhythm Abdomen: soft, non-tender; bowel sounds normal; no masses,  no organomegaly Extremities: extremities normal, atraumatic, no cyanosis or edema Pulses: 2+ and symmetric Skin: Skin color, texture, turgor normal. No rashes or lesions Neurologic: Grossly normal Psych: Pleasant  EKG: Deferred  ASSESSMENT: 1. Probable familial hyperlipidemia, LDL greater than 190 2. Intolerance to simvastatin, now on atorvastatin 3. Progressive dyspnea and chest discomfort concerning for angina 4. Family history of coronary disease in her mother.  PLAN: 1.   [name] has probable familial hyperlipidemia based on her significantly elevated cholesterol.  Her on treatment LDL is 185 in the setting of ezetimibe however she has had previous labs which have demonstrated an LDL of 232 in 2019.   Moreover, after further questioning I am concerned that she may be having symptoms consistent with unstable angina.  I am interested in further evaluating this and would recommend CT coronary angiography.  Heart rate is elevated today however she was quite anxious and will likely need adequate beta-blockade in order to perform that study.  I have recommended also increasing her atorvastatin from 10 to 40 mg daily to target LDL less than 70 and provide at least 50% reduction in LDL cholesterol as per guidelines.  She already is on low-dose aspirin and ezetimibe.  Follow-up with me after her coronary CT angiogram.  Repeat lipids in 3 months.  Thanks again for the kind referral.  Chrissie Noa  C. Debara Pickett, MD, [facility], [place] Director of the Advanced Lipid Disorders &  Cardiovascular Risk Reduction Clinic Diplomate of the American Board of Clinical Lipidology Attending Cardiologist  Direct Dial: [phone]  Fax: [phone]  Website:  [email or web address] Novalie Leamy 07/03/2020, 11:58 AM

## 2020-07-03 ENCOUNTER — Encounter: Payer: Self-pay | Admitting: Internal Medicine

## 2020-07-03 LAB — BASIC METABOLIC PANEL
BUN/Creatinine Ratio: 12 (ref 9–23)
BUN: 11 mg/dL (ref 6–24)
CO2: 26 mmol/L (ref 20–29)
Calcium: 9.6 mg/dL (ref 8.7–10.2)
Chloride: 104 mmol/L (ref 96–106)
Creatinine, Ser: 0.93 mg/dL (ref 0.57–1.00)
GFR calc Af Amer: 79 mL/min/{1.73_m2} (ref >59)
GFR calc non Af Amer: 68 mL/min/{1.73_m2} (ref >59)
Glucose: 90 mg/dL (ref 65–99)
Potassium: 4.5 mmol/L (ref 3.5–5.2)
Sodium: 143 mmol/L (ref 134–144)

## 2020-07-14 ENCOUNTER — Ambulatory Visit (INDEPENDENT_AMBULATORY_CARE_PROVIDER_SITE_OTHER): Payer: 59 | Admitting: Psychology

## 2020-07-14 DIAGNOSIS — F4323 Adjustment disorder with mixed anxiety and depressed mood: Secondary | ICD-10-CM

## 2020-07-28 ENCOUNTER — Telehealth (HOSPITAL_COMMUNITY): Payer: Self-pay | Admitting: Emergency Medicine

## 2020-07-28 ENCOUNTER — Ambulatory Visit (INDEPENDENT_AMBULATORY_CARE_PROVIDER_SITE_OTHER): Payer: 59 | Admitting: Psychology

## 2020-07-28 DIAGNOSIS — F4323 Adjustment disorder with mixed anxiety and depressed mood: Secondary | ICD-10-CM | POA: Diagnosis not present

## 2020-07-28 NOTE — Telephone Encounter (Signed)
Attempted to call patient regarding upcoming cardiac CT appointment. °Left message on voicemail with name and callback number °Allexa Acoff RN Navigator Cardiac Imaging °[facility] °[phone] Office °[phone] Cell ° °

## 2020-07-30 ENCOUNTER — Other Ambulatory Visit: Payer: Self-pay

## 2020-07-30 ENCOUNTER — Ambulatory Visit (HOSPITAL_COMMUNITY)
Admission: RE | Admit: 2020-07-30 | Discharge: 2020-07-30 | Disposition: A | Discharge status: Home / Self Care | Payer: 59 | Source: Ambulatory Visit | Attending: Internal Medicine | Admitting: Internal Medicine

## 2020-07-30 DIAGNOSIS — R0602 Shortness of breath: Secondary | ICD-10-CM

## 2020-07-30 DIAGNOSIS — Z8249 Family history of ischemic heart disease and other diseases of the circulatory system: Secondary | ICD-10-CM

## 2020-07-30 DIAGNOSIS — E7849 Other hyperlipidemia: Secondary | ICD-10-CM

## 2020-07-30 DIAGNOSIS — R0789 Other chest pain: Secondary | ICD-10-CM | POA: Diagnosis not present

## 2020-07-30 MED ORDER — NITROGLYCERIN 0.4 MG SL SUBL
SUBLINGUAL_TABLET | SUBLINGUAL | Status: AC
  Filled 2020-07-30: qty 2

## 2020-07-30 MED ORDER — NITROGLYCERIN 0.4 MG SL SUBL
0.8000 mg | SUBLINGUAL_TABLET | Freq: Once | SUBLINGUAL | Status: AC
  Administered 2020-07-30: 0.8 mg via SUBLINGUAL

## 2020-07-30 MED ORDER — IOHEXOL 350 MG/ML SOLN
80.0000 mL | Freq: Once | INTRAVENOUS | Status: AC | PRN
  Administered 2020-07-30: 80 mL via INTRAVENOUS

## 2020-08-11 ENCOUNTER — Ambulatory Visit (INDEPENDENT_AMBULATORY_CARE_PROVIDER_SITE_OTHER): Payer: 59 | Admitting: Psychology

## 2020-08-11 DIAGNOSIS — F4323 Adjustment disorder with mixed anxiety and depressed mood: Secondary | ICD-10-CM | POA: Diagnosis not present

## 2020-08-25 ENCOUNTER — Ambulatory Visit: Payer: 59 | Admitting: Psychology

## 2020-09-08 ENCOUNTER — Ambulatory Visit (INDEPENDENT_AMBULATORY_CARE_PROVIDER_SITE_OTHER): Payer: 59 | Admitting: Psychology

## 2020-09-08 DIAGNOSIS — F4323 Adjustment disorder with mixed anxiety and depressed mood: Secondary | ICD-10-CM

## 2020-09-22 ENCOUNTER — Ambulatory Visit (INDEPENDENT_AMBULATORY_CARE_PROVIDER_SITE_OTHER): Payer: 59 | Admitting: Psychology

## 2020-09-22 DIAGNOSIS — F4323 Adjustment disorder with mixed anxiety and depressed mood: Secondary | ICD-10-CM

## 2020-10-02 LAB — LIPID PANEL
Chol/HDL Ratio: 3.4 ratio (ref 0.0–4.4)
Cholesterol, Total: 172 mg/dL (ref 100–199)
HDL: 50 mg/dL (ref >39)
LDL Chol Calc (NIH): 96 mg/dL (ref 0–99)
Triglycerides: 151 mg/dL — ABNORMAL HIGH (ref 0–149)
VLDL Cholesterol Cal: 26 mg/dL (ref 5–40)

## 2020-10-06 ENCOUNTER — Ambulatory Visit (INDEPENDENT_AMBULATORY_CARE_PROVIDER_SITE_OTHER): Payer: 59 | Admitting: Psychology

## 2020-10-06 DIAGNOSIS — F4323 Adjustment disorder with mixed anxiety and depressed mood: Secondary | ICD-10-CM | POA: Diagnosis not present

## 2020-10-07 ENCOUNTER — Encounter: Payer: Self-pay | Admitting: Internal Medicine

## 2020-10-07 ENCOUNTER — Telehealth (INDEPENDENT_AMBULATORY_CARE_PROVIDER_SITE_OTHER): Payer: 59 | Admitting: Internal Medicine

## 2020-10-07 VITALS — HR 103

## 2020-10-07 DIAGNOSIS — E7800 Pure hypercholesterolemia, unspecified: Secondary | ICD-10-CM

## 2020-10-07 DIAGNOSIS — R079 Chest pain, unspecified: Secondary | ICD-10-CM

## 2020-10-07 DIAGNOSIS — Z8249 Family history of ischemic heart disease and other diseases of the circulatory system: Secondary | ICD-10-CM

## 2020-10-07 NOTE — Progress Notes (Signed)
Virtual Visit via Video Note   This visit type was conducted due to national recommendations for restrictions regarding the COVID-19 Pandemic (e.g. social distancing) in an effort to limit this patient's exposure and mitigate transmission in our community.  Due to her co-morbid illnesses, this patient is at least at moderate risk for complications without adequate follow up.  This format is felt to be most appropriate for this patient at this time.  All issues noted in this document were discussed and addressed.  A limited physical exam was performed with this format.  Please refer to the patient's chart for her consent to telehealth for [facility].      Date:  10/07/2020   ID:  Ashlee Hernandez, DOB [birth year], MRN [id number] The patient was identified using 2 identifiers.  Evaluation Performed:  Follow-Up Visit  Patient Location:  [address] [place] [address]  Provider location:   [address] [place], [place] [address]  PCP:  Fatima Sanger, MD  Cardiologist:  No primary care provider on file. Electrophysiologist:  None   Chief Complaint:  Follow-up dyslipidemia  History of Present Illness:    Ashlee Hernandez is a 60 y.o. female who presents via audio/video conferencing for a telehealth visit today.   She reports a longstanding history of dyslipidemia and unfortunately statin intolerance.  She has been recently evaluated by her PCP and was noted to have significantly elevated cholesterol.  She had previously been on simvastatin but had possible memory loss related to that.  It is questionable whether she had some myalgias however stopped the medication and subsequently was diagnosed with ADHD.  She now thinks that the memory issues may be related to that.  More recently she was started on 10 mg of atorvastatin and she has also been on ezetimibe 10 mg daily.  In review of her previous lipid profiles more recently total cholesterol was noted to be 275, triglycerides  229, HDL 46 and LDL 185.  She reports being a former smoker but quit many years ago.  She has 2 children.  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  Family history significant for father who died of lung disease secondary to alpha-1 antitrypsin deficiency in mother who is alive but has hyperlipidemia and coronary disease with a heart attack in her 64s.  Additionally, when discussing her review of systems today, more recently she has noted some increasing fatigue with exercise.  She and her husband have a home gym set up and she notes that over the past several months it has been increasingly difficult for her to exercise.  She does get some discomfort across the upper back and chest which is worse with exertion and is relieved ultimately with rest.  10/07/2020  Ashlee Hernandez is seen today in follow-up.  She underwent CT coronary angiography in December due to exercise related chest discomfort.  Please to say that this demonstrated 0 coronary calcium and minimal nonobstructive coronary disease that was noncalcified.  No significant extracardiac findings were noted.  She has tolerated the increase in her atorvastatin from 10 to 40 mg and remains on ezetimibe.  Her lipids are substantially improved.  Total cholesterol is now 172, triglycerides 151, HDL 50 and LDL 96.  Given minimal coronary artery disease however family history, I would recommend remaining on her current dose of medications with a target LDL less than 100.  The patient does not have symptoms concerning for COVID-19  infection (fever, chills, cough, or new SHORTNESS OF BREATH).    Prior CV studies:   The following studies were reviewed today:  Chart reviewed, lab work, CT reviewed  PMHx:  Past Medical History:  Diagnosis Date  . Chronic kidney disease   . Vision abnormalities     Past Surgical History:  Procedure Laterality Date  . ABDOMINOPLASTY    . ELBOW FRACTURE SURGERY    . MOUTH  SURGERY      FAMHx:  Family History  Problem Relation Age of Onset  . Heart attack Mother   . Hypertension Mother   . Alpha-1 antitrypsin deficiency Father   . COPD Father     SOCHx:   reports that she has quit smoking. She has quit using smokeless tobacco. She reports previous alcohol use. She reports previous drug use.  ALLERGIES:  Allergies  Allergen Reactions  . Sulfa Antibiotics Hives, Itching and Rash    MEDS:  Current Meds  Medication Sig  . ADVAIR DISKUS 100-50 MCG/DOSE AEPB Inhale 1 puff into the lungs 2 (two) times daily.   Marland Kitchen albuterol (PROVENTIL HFA;VENTOLIN HFA) 108 (90 Base) MCG/ACT inhaler INHALE 2 PUFFS BY MOUTH 4 TIMES A DAY EVERY 4-6 HRS INHALATION 30 **INS WON'T PAY FOR PROVENTIL**  . ASPIRIN 81 PO Take 1 tablet by mouth daily.  Marland Kitchen atorvastatin (LIPITOR) 40 MG tablet Take 1 tablet (40 mg total) by mouth daily.  Marland Kitchen buPROPion (WELLBUTRIN XL) 300 MG 24 hr tablet Take 300 mg by mouth daily.  . clobetasol (TEMOVATE) 0.05 % external solution Apply 1 application topically daily as needed.  . ezetimibe (ZETIA) 10 MG tablet Take 10 mg by mouth daily.  . Omega-3 Fatty Acids (FISH OIL) 1000 MG CAPS Take 2 capsules by mouth daily.     ROS: Pertinent items noted in HPI and remainder of comprehensive ROS otherwise negative.  Labs/Other Tests and Data Reviewed:    Recent Labs: 07/02/2020: BUN 11; Creatinine, Ser 0.93; Potassium 4.5; Sodium 143   Recent Lipid Panel Lab Results  Component Value Date/Time   CHOL 172 10/02/2020 08:38 AM   TRIG 151 (H) 10/02/2020 08:38 AM   HDL 50 10/02/2020 08:38 AM   CHOLHDL 3.4 10/02/2020 08:38 AM   LDLCALC 96 10/02/2020 08:38 AM    Wt Readings from Last 3 Encounters:  07/02/20 182 lb (82.6 kg)  06/30/18 166 lb 8 oz (75.5 kg)  12/28/17 161 lb 8 oz (73.3 kg)     Exam:    Vital Signs:  Pulse (!) 103   SpO2 99%    General appearance: alert and no distress Lungs: No visual respiratory difficulty Abdomen: Normal  weight Extremities: extremities normal, atraumatic, no cyanosis or edema Skin: Skin color, texture, turgor normal. No rashes or lesions Neurologic: Grossly normal Psych: Pleasant  ASSESSMENT & PLAN:    1. Possible familial hyperlipidemia, LDL greater than 190 2. Intolerance to simvastatin, now on atorvastatin 3. Progressive dyspnea and chest discomfort -low risk coronary CT angiogram was 0 coronary artery calcium and minimal nonobstructive coronary disease (07/2020) 4. Family history of coronary disease in her mother.  Ashlee Hernandez had a very high LDL cholesterol with possible familial hyperlipidemia however her CT coronary angiogram showed minimal coronary disease with no calcification.  This is overall reassuring and suggest that her exercise-induced chest discomfort is probably not cardiac.  She seems to be tolerating atorvastatin at 40 mg as well as the ezetimibe and her LDL is now less than 100.  I would simply recommend  she remain on her current regimen and keep her LDL below 100.  As her PCP had already prescribed her atorvastatin, I think that she could continue to receive her prescriptions from his office.  I am happy to see her in follow-up as needed.  COVID-19 Education: The signs and symptoms of COVID-19 were discussed with the patient and how to seek care for testing (follow up with PCP or arrange E-visit).  The importance of social distancing was discussed today.  Patient Risk:   After full review of this patients clinical status, I feel that they are at least moderate risk at this time.  Time:   Today, I have spent 15 minutes with the patient with telehealth technology discussing dyslipidemia, CT coronary results, lab work.     Medication Adjustments/Labs and Tests Ordered: Current medicines are reviewed at length with the patient today.  Concerns regarding medicines are outlined above.   Tests Ordered: No orders of the defined types were placed in this  encounter.   Medication Changes: No orders of the defined types were placed in this encounter.   Disposition:  prn  Chrystie Nose, MD, Milagros Loll  [place]  [facility]  Medical Director of the Advanced Lipid Disorders &  Cardiovascular Risk Reduction Clinic Diplomate of the American Board of Clinical Lipidology Attending Cardiologist  Direct Dial: [phone]  Fax: [phone]  Website:  [email or web address]  Chrystie Nose, MD  10/07/2020 8:10 AM

## 2020-10-07 NOTE — Patient Instructions (Signed)
Medication Instructions:  Your physician recommends that you continue on your current medications as directed. Please refer to the Current Medication list given to you today.  *If you need a refill on your cardiac medications before your next appointment, please call your pharmacy*   Follow-Up: At [facility], you and your health needs are our priority.  As part of our continuing mission to provide you with exceptional heart care, we have created designated Provider Care Teams.  These Care Teams include your primary Cardiologist (physician) and Advanced Practice Providers (APPs -  Physician Assistants and Nurse Practitioners) who all work together to provide you with the care you need, when you need it.  We recommend signing up for the patient portal called "MyChart".  Sign up information is provided on this After Visit Summary.  MyChart is used to connect with patients for Virtual Visits (Telemedicine).  Patients are able to view lab/test results, encounter notes, upcoming appointments, etc.  Non-urgent messages can be sent to your provider as well.   To learn more about what you can do with MyChart, go to [email or web address].    Your next appointment:   AS NEEDED with Dr. Hilty for lipid management    

## 2020-10-20 ENCOUNTER — Ambulatory Visit (INDEPENDENT_AMBULATORY_CARE_PROVIDER_SITE_OTHER): Payer: 59 | Admitting: Psychology

## 2020-10-20 DIAGNOSIS — F4323 Adjustment disorder with mixed anxiety and depressed mood: Secondary | ICD-10-CM | POA: Diagnosis not present

## 2020-11-03 ENCOUNTER — Ambulatory Visit (INDEPENDENT_AMBULATORY_CARE_PROVIDER_SITE_OTHER): Payer: 59 | Admitting: Psychology

## 2020-11-03 DIAGNOSIS — F4323 Adjustment disorder with mixed anxiety and depressed mood: Secondary | ICD-10-CM

## 2020-11-17 ENCOUNTER — Ambulatory Visit (INDEPENDENT_AMBULATORY_CARE_PROVIDER_SITE_OTHER): Payer: 59 | Admitting: Psychology

## 2020-11-17 DIAGNOSIS — F4323 Adjustment disorder with mixed anxiety and depressed mood: Secondary | ICD-10-CM

## 2020-12-01 ENCOUNTER — Ambulatory Visit: Payer: 59 | Admitting: Psychology

## 2020-12-15 ENCOUNTER — Ambulatory Visit: Payer: 59 | Admitting: Psychology

## 2020-12-29 ENCOUNTER — Ambulatory Visit: Payer: 59 | Admitting: Psychology

## 2021-01-12 ENCOUNTER — Ambulatory Visit: Payer: 59 | Admitting: Psychology

## 2021-02-09 ENCOUNTER — Ambulatory Visit: Payer: 59 | Admitting: Psychology

## 2021-02-23 ENCOUNTER — Ambulatory Visit: Payer: 59 | Admitting: Psychology

## 2021-03-09 ENCOUNTER — Ambulatory Visit: Payer: 59 | Admitting: Psychology

## 2021-03-23 ENCOUNTER — Ambulatory Visit: Payer: 59 | Admitting: Psychology

## 2021-04-01 DIAGNOSIS — L814 Other melanin hyperpigmentation: Secondary | ICD-10-CM | POA: Diagnosis not present

## 2021-04-01 DIAGNOSIS — L821 Other seborrheic keratosis: Secondary | ICD-10-CM | POA: Diagnosis not present

## 2021-04-01 DIAGNOSIS — L308 Other specified dermatitis: Secondary | ICD-10-CM | POA: Diagnosis not present

## 2021-04-01 DIAGNOSIS — L820 Inflamed seborrheic keratosis: Secondary | ICD-10-CM | POA: Diagnosis not present

## 2021-04-01 DIAGNOSIS — L578 Other skin changes due to chronic exposure to nonionizing radiation: Secondary | ICD-10-CM | POA: Diagnosis not present

## 2021-04-06 ENCOUNTER — Ambulatory Visit: Payer: 59 | Admitting: Psychology

## 2021-05-29 DIAGNOSIS — E785 Hyperlipidemia, unspecified: Secondary | ICD-10-CM | POA: Diagnosis not present

## 2021-05-29 DIAGNOSIS — Z131 Encounter for screening for diabetes mellitus: Secondary | ICD-10-CM | POA: Diagnosis not present

## 2021-05-29 DIAGNOSIS — Z0000 Encounter for general adult medical examination without abnormal findings: Secondary | ICD-10-CM | POA: Diagnosis not present

## 2021-05-29 DIAGNOSIS — Z23 Encounter for immunization: Secondary | ICD-10-CM | POA: Diagnosis not present

## 2021-10-20 DIAGNOSIS — S161XXA Strain of muscle, fascia and tendon at neck level, initial encounter: Secondary | ICD-10-CM | POA: Diagnosis not present

## 2021-12-18 DIAGNOSIS — Z1231 Encounter for screening mammogram for malignant neoplasm of breast: Secondary | ICD-10-CM | POA: Diagnosis not present

## 2021-12-25 DIAGNOSIS — R928 Other abnormal and inconclusive findings on diagnostic imaging of breast: Secondary | ICD-10-CM | POA: Diagnosis not present

## 2022-04-13 DIAGNOSIS — B3731 Acute candidiasis of vulva and vagina: Secondary | ICD-10-CM | POA: Diagnosis not present

## 2022-04-13 DIAGNOSIS — Z6824 Body mass index (BMI) 24.0-24.9, adult: Secondary | ICD-10-CM | POA: Diagnosis not present

## 2022-04-13 DIAGNOSIS — H60392 Other infective otitis externa, left ear: Secondary | ICD-10-CM | POA: Diagnosis not present

## 2022-04-13 DIAGNOSIS — H66005 Acute suppurative otitis media without spontaneous rupture of ear drum, recurrent, left ear: Secondary | ICD-10-CM | POA: Diagnosis not present

## 2022-06-13 IMAGING — CT CT HEART MORP W/ CTA COR W/ SCORE W/ CA W/CM &/OR W/O CM
4 of 7 series · 9 of 20 positions shown, 10 images · IV contrast (APPLIED)
Comparison: None.
COMPARISON: None.

Addendum:
EXAM:
OVER-READ INTERPRETATION  CT CHEST

The following report is an over-read performed by radiologist Dr.
Hao Wei Gombloh [REDACTED] on 07/30/2020. This
over-read does not include interpretation of cardiac or coronary
anatomy or pathology. The coronary calcium score/coronary CTA
interpretation by the cardiologist is attached.
CLINICAL DATA: 59 Year old White Female
Cardiac/Coronary  CTA
TECHNIQUE: The patient was scanned on a Phillips Force scanner.

[Series 6: best diast 71 % · axial · 0.37mm/px · z∈[+1146,+1217]mm · 3 of 358 slices shown, 4 images]
[im 90/358  vessel]
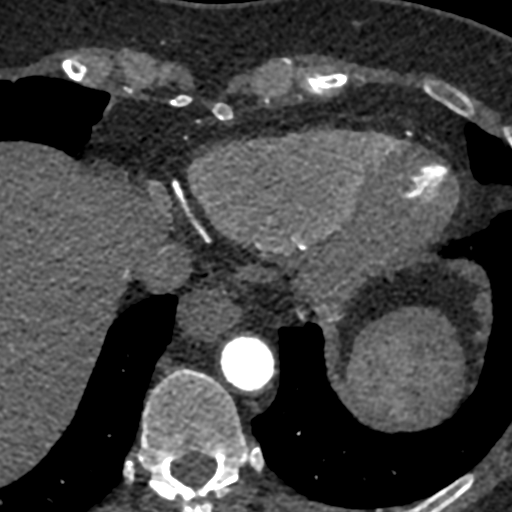
[im 90/358  lung]
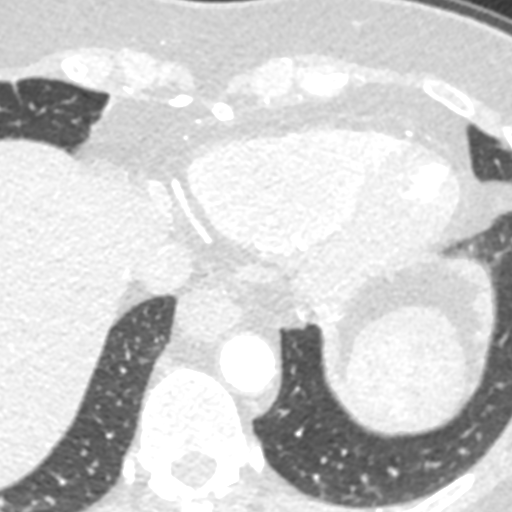
[im 179/358  vessel]
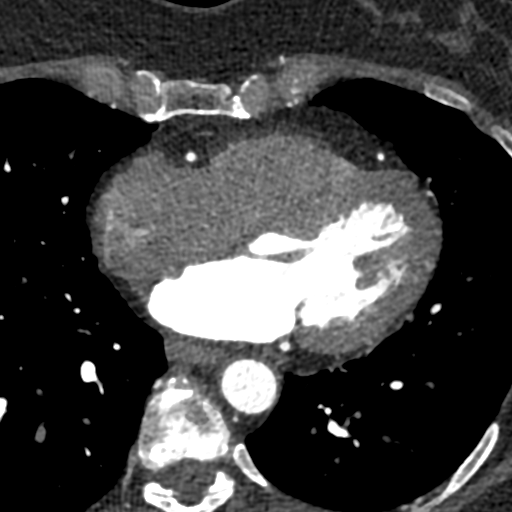
[im 268/358  vessel]
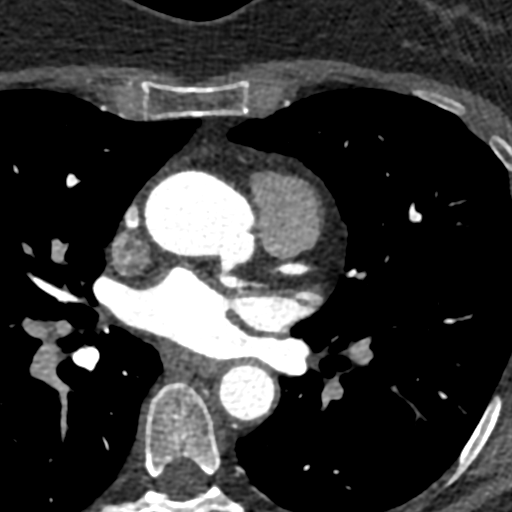

[Series 7: best syst 32 % · axial · 0.37mm/px · z∈[+1165,+1209]mm · 2 of 331 slices shown]
[im 111/331  vessel]
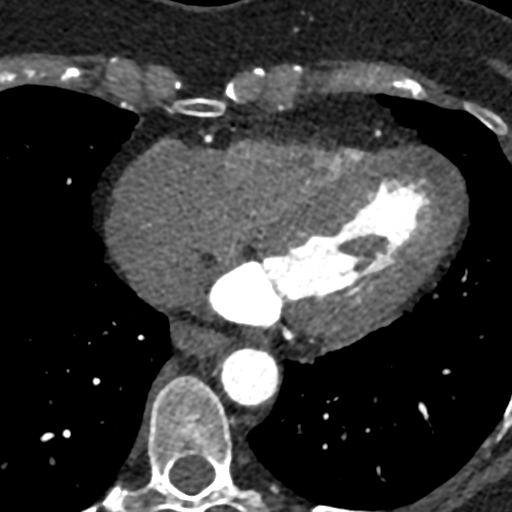
[im 221/331  vessel]
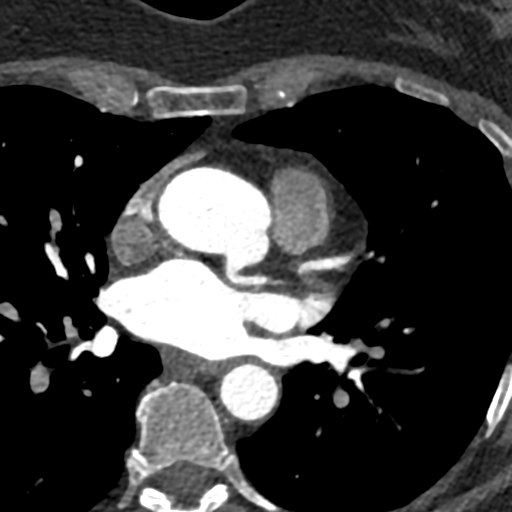

[Series 8: ts diast sharp 71 % · axial · 0.37mm/px · z∈[+1165,+1209]mm · 2 of 331 slices shown]
[im 111/331  lung]
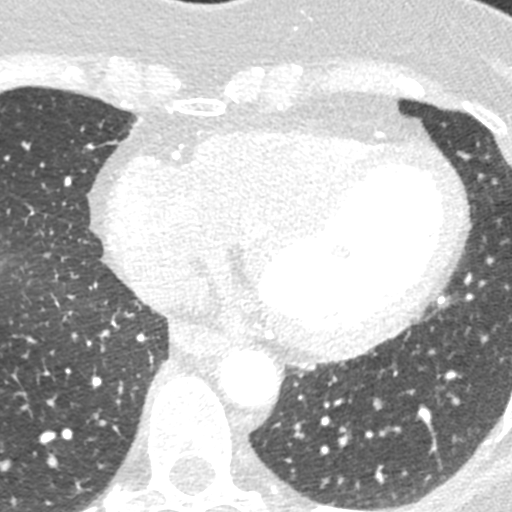
[im 221/331  lung]
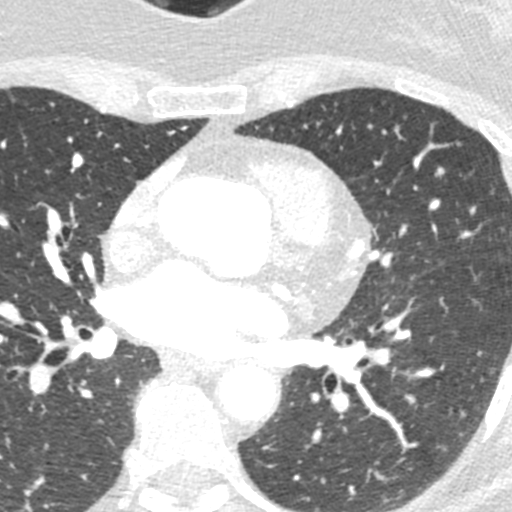

[Series 9: ts syst sharp 32 % · axial · 0.37mm/px · z∈[+1165,+1209]mm · 2 of 331 slices shown]
[im 111/331  lung]
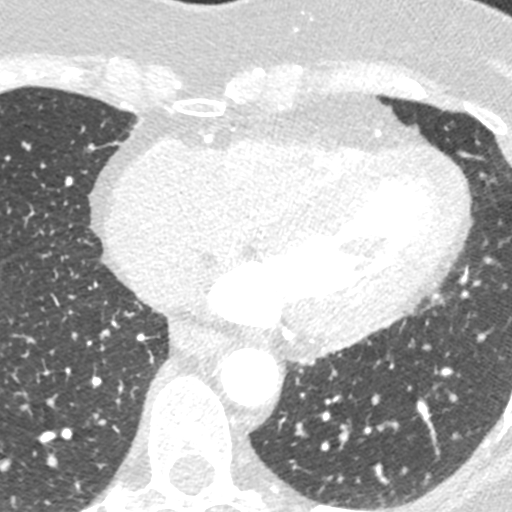
[im 221/331  lung]
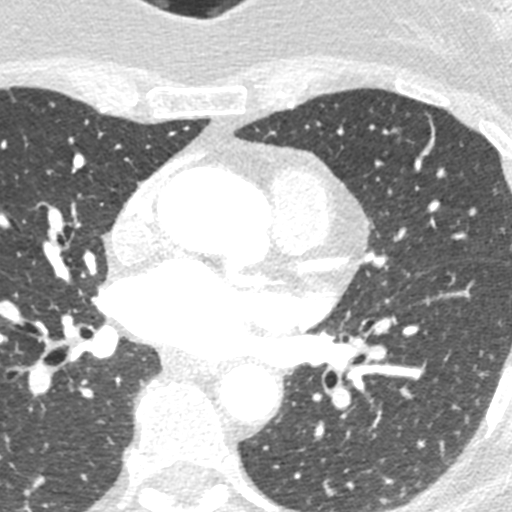

[9 of 20 positions shown; findings below may reference images not displayed]

FINDINGS: Within the visualized portions of the thorax there are no suspicious
appearing pulmonary nodules or masses, there is no acute
consolidative airspace disease, no pleural effusions, no
pneumothorax and no lymphadenopathy. Visualized portions of the
upper abdomen are unremarkable. There are no aggressive appearing
lytic or blastic lesions noted in the visualized portions of the
skeleton.
IMPRESSION: No significant incidental noncardiac findings are noted.
FINDINGS: A 100 kV prospective scan was triggered in the descending thoracic
aorta at 111 HU's. Axial non-contrast 3 mm slices were carried out
through the heart. The data set was analyzed on a dedicated work
station and scored using the Agatson method. Gantry rotation speed
was 250 msecs and collimation was .6 mm. No beta blockade and 0.8 mg
of sl NTG was given. The 3D data set was reconstructed in 5%
intervals of the 67-82 % of the R-R cycle. Diastolic phases were
analyzed on a dedicated work station using MPR, MIP and VRT modes.
The patient received 80 cc of contrast.

Aorta:  Normal size.  No calcifications.  No dissection.

Aortic Valve:  Tri-leaflet.  No calcifications.

Coronary Arteries:  Normal coronary origin.  Right dominance.

Coronary calcium score of 0. This was 1st percentile for age, sex,
and race matched control.

RCA is a large dominant artery that gives rise to PDA and PLA. There
is a minimal (1-24%) non-obstructive narrowing without discrete
plaque at the proximal vessel.

Left main is a large artery that gives rise to LAD and LCX arteries.
Short left main noted.

LAD is a large vessel that gives rise to a small D1 and large D2
branch. There is a minimal (1-24%) non-obstructive soft plaque in
the mid vessel.

LCX is a non-dominant artery that gives rise to one large OM1
branch. There is no plaque.

Other findings:

Normal pulmonary vein drainage into the left atrium.

Normal left atrial appendage without a thrombus.

Normal size of the pulmonary artery.

Extra-cardiac findings: See attached radiology report for
non-cardiac structures.
IMPRESSION: 1. Normal coronary origin.  Right dominance.

2. Coronary calcium score of 0. This was 1st percentile for age,
sex, and race matched control.

3. CAD-RADS 1. Minimal non-obstructive CAD (1-24%). Consider
non-atherosclerotic causes of chest pain. Consider preventive
therapy and risk factor modification.

*** End of Addendum ***
EXAM:
OVER-READ INTERPRETATION  CT CHEST

The following report is an over-read performed by radiologist Dr.
Hao Wei Gombloh [REDACTED] on 07/30/2020. This
over-read does not include interpretation of cardiac or coronary
anatomy or pathology. The coronary calcium score/coronary CTA
interpretation by the cardiologist is attached.
FINDINGS: Within the visualized portions of the thorax there are no suspicious
appearing pulmonary nodules or masses, there is no acute
consolidative airspace disease, no pleural effusions, no
pneumothorax and no lymphadenopathy. Visualized portions of the
upper abdomen are unremarkable. There are no aggressive appearing
lytic or blastic lesions noted in the visualized portions of the
skeleton.
IMPRESSION: No significant incidental noncardiac findings are noted.

## 2023-04-13 DIAGNOSIS — L308 Other specified dermatitis: Secondary | ICD-10-CM | POA: Diagnosis not present

## 2023-04-13 DIAGNOSIS — L820 Inflamed seborrheic keratosis: Secondary | ICD-10-CM | POA: Diagnosis not present

## 2023-04-13 DIAGNOSIS — L570 Actinic keratosis: Secondary | ICD-10-CM | POA: Diagnosis not present

## 2023-04-13 DIAGNOSIS — L821 Other seborrheic keratosis: Secondary | ICD-10-CM | POA: Diagnosis not present

## 2023-04-13 DIAGNOSIS — D485 Neoplasm of uncertain behavior of skin: Secondary | ICD-10-CM | POA: Diagnosis not present

## 2023-04-13 DIAGNOSIS — L578 Other skin changes due to chronic exposure to nonionizing radiation: Secondary | ICD-10-CM | POA: Diagnosis not present

## 2023-04-13 DIAGNOSIS — L219 Seborrheic dermatitis, unspecified: Secondary | ICD-10-CM | POA: Diagnosis not present

## 2023-09-26 DIAGNOSIS — E785 Hyperlipidemia, unspecified: Secondary | ICD-10-CM | POA: Diagnosis not present

## 2023-09-26 DIAGNOSIS — Z0000 Encounter for general adult medical examination without abnormal findings: Secondary | ICD-10-CM | POA: Diagnosis not present

## 2023-09-26 DIAGNOSIS — J4540 Moderate persistent asthma, uncomplicated: Secondary | ICD-10-CM | POA: Diagnosis not present

## 2023-09-26 DIAGNOSIS — F331 Major depressive disorder, recurrent, moderate: Secondary | ICD-10-CM | POA: Diagnosis not present

## 2023-10-05 ENCOUNTER — Other Ambulatory Visit (HOSPITAL_COMMUNITY)
Admission: RE | Admit: 2023-10-05 | Discharge: 2023-10-05 | Disposition: A | Discharge status: Home / Self Care | Payer: BC Managed Care – PPO | Source: Ambulatory Visit | Attending: Nurse Practitioner | Admitting: Nurse Practitioner

## 2023-10-05 ENCOUNTER — Other Ambulatory Visit: Payer: Self-pay | Admitting: Nurse Practitioner

## 2023-10-05 DIAGNOSIS — Z124 Encounter for screening for malignant neoplasm of cervix: Secondary | ICD-10-CM | POA: Insufficient documentation

## 2023-10-05 DIAGNOSIS — N644 Mastodynia: Secondary | ICD-10-CM | POA: Diagnosis not present

## 2023-10-05 DIAGNOSIS — N9489 Other specified conditions associated with female genital organs and menstrual cycle: Secondary | ICD-10-CM | POA: Diagnosis not present

## 2023-10-05 DIAGNOSIS — N958 Other specified menopausal and perimenopausal disorders: Secondary | ICD-10-CM | POA: Diagnosis not present

## 2023-10-05 DIAGNOSIS — Z01419 Encounter for gynecological examination (general) (routine) without abnormal findings: Secondary | ICD-10-CM | POA: Diagnosis not present

## 2023-10-05 DIAGNOSIS — N951 Menopausal and female climacteric states: Secondary | ICD-10-CM | POA: Diagnosis not present

## 2023-10-07 DIAGNOSIS — N9489 Other specified conditions associated with female genital organs and menstrual cycle: Secondary | ICD-10-CM | POA: Diagnosis not present

## 2023-10-10 LAB — CYTOLOGY - PAP
Comment: NEGATIVE
Diagnosis: NEGATIVE
High risk HPV: NEGATIVE

## 2024-01-06 DIAGNOSIS — Z1231 Encounter for screening mammogram for malignant neoplasm of breast: Secondary | ICD-10-CM | POA: Diagnosis not present

## 2024-04-11 DIAGNOSIS — L821 Other seborrheic keratosis: Secondary | ICD-10-CM | POA: Diagnosis not present

## 2024-04-11 DIAGNOSIS — L578 Other skin changes due to chronic exposure to nonionizing radiation: Secondary | ICD-10-CM | POA: Diagnosis not present

## 2024-04-11 DIAGNOSIS — L570 Actinic keratosis: Secondary | ICD-10-CM | POA: Diagnosis not present

## 2024-04-11 DIAGNOSIS — D225 Melanocytic nevi of trunk: Secondary | ICD-10-CM | POA: Diagnosis not present

## 2024-04-11 DIAGNOSIS — Z8589 Personal history of malignant neoplasm of other organs and systems: Secondary | ICD-10-CM | POA: Diagnosis not present

## 2024-08-13 DIAGNOSIS — K648 Other hemorrhoids: Secondary | ICD-10-CM | POA: Diagnosis not present

## 2024-08-13 DIAGNOSIS — D120 Benign neoplasm of cecum: Secondary | ICD-10-CM | POA: Diagnosis not present

## 2024-08-13 DIAGNOSIS — Z860101 Personal history of adenomatous and serrated colon polyps: Secondary | ICD-10-CM | POA: Diagnosis not present

## 2024-08-13 DIAGNOSIS — D123 Benign neoplasm of transverse colon: Secondary | ICD-10-CM | POA: Diagnosis not present

## 2024-08-13 DIAGNOSIS — D128 Benign neoplasm of rectum: Secondary | ICD-10-CM | POA: Diagnosis not present

## 2024-08-13 DIAGNOSIS — Z09 Encounter for follow-up examination after completed treatment for conditions other than malignant neoplasm: Secondary | ICD-10-CM | POA: Diagnosis not present
